# Patient Record
Sex: Female | Born: 1980 | Race: White | Hispanic: No | Marital: Single | Smoking: Former smoker
Health system: Southern US, Community
[De-identification: ages and names within clinical notes are randomized; demographics above are authoritative.]

## PROBLEM LIST (undated history)

## (undated) DIAGNOSIS — D649 Anemia, unspecified: Secondary | ICD-10-CM

## (undated) HISTORY — PX: TUBAL LIGATION: SHX77

---

## 2013-08-30 ENCOUNTER — Emergency Department (HOSPITAL_BASED_OUTPATIENT_CLINIC_OR_DEPARTMENT_OTHER)
Admission: EM | Admit: 2013-08-30 | Discharge: 2013-08-30 | Disposition: A | Discharge status: Home / Self Care | Payer: Self-pay | Attending: Emergency Medicine | Admitting: Emergency Medicine

## 2013-08-30 ENCOUNTER — Encounter (HOSPITAL_BASED_OUTPATIENT_CLINIC_OR_DEPARTMENT_OTHER): Payer: Self-pay | Admitting: Emergency Medicine

## 2013-08-30 DIAGNOSIS — F17200 Nicotine dependence, unspecified, uncomplicated: Secondary | ICD-10-CM | POA: Insufficient documentation

## 2013-08-30 DIAGNOSIS — J329 Chronic sinusitis, unspecified: Secondary | ICD-10-CM | POA: Insufficient documentation

## 2013-08-30 MED ORDER — AMOXICILLIN 500 MG PO CAPS: 500.0000 mg | ORAL_CAPSULE | Freq: Three times a day (TID) | ORAL | Status: DC

## 2013-08-30 NOTE — ED Provider Notes (Signed)
CSN: [id number]     Arrival date & time 08/30/13  1646 History   First MD Initiated Contact with Patient 08/30/13 1711     Chief Complaint  Patient presents with  . Facial Pain     (Consider location/radiation/quality/duration/timing/severity/associated sxs/prior Treatment) HPI Comments: Pt states that she has had facial pain and headache on the left side for 1 week. Pt states that the symptoms started after having a cold. Denies fever or visual changes.  The history is provided by the patient. No language interpreter was used.    History reviewed. No pertinent past medical history. Past Surgical History  Procedure Laterality Date  . Tubal ligation     No family history on file. History  Substance Use Topics  . Smoking status: Current Every Day Smoker  . Smokeless tobacco: Not on file  . Alcohol Use: No   OB History   Grav Para Term Preterm Abortions TAB SAB Ect Mult Living                 Review of Systems  Constitutional: Negative.   Respiratory: Negative.   Cardiovascular: Negative.       Allergies  Review of patient's allergies indicates no known allergies.  Home Medications   Prior to Admission medications   Medication Sig Start Date End Date Taking? Authorizing Provider  amoxicillin (AMOXIL) 500 MG capsule Take 1 capsule (500 mg total) by mouth 3 (three) times daily. 08/30/13   Teressa LowerVrinda Tiannah Greenly, NP   BP 145/84  Pulse 77  Temp(Src) 97.9 F (36.6 C) (Oral)  Resp 18  Ht 5\' 5"  (1.651 m)  Wt 170 lb (77.111 kg)  BMI 28.29 kg/m2  SpO2 100%  LMP 08/23/2013 Physical Exam  Nursing note and vitals reviewed. Constitutional: She is oriented to person, place, and time. She appears well-developed and well-nourished.  HENT:  Right Ear: External ear normal.  Left Ear: External ear normal.  Nose: Mucosal edema present. Left sinus exhibits frontal sinus tenderness.  Cardiovascular: Normal rate and regular rhythm.   Pulmonary/Chest: Effort normal and breath sounds  normal.  Musculoskeletal: Normal range of motion.  Neurological: She is alert and oriented to person, place, and time.    ED Course  Procedures (including critical care time) Labs Review Labs Reviewed - No data to display  Imaging Review No results found.   EKG Interpretation None      MDM   Final diagnoses:  Sinusitis    Will treat for sinusitis     Teressa LowerVrinda Edna Rede, NP 08/30/13 1746

## 2013-08-30 NOTE — ED Notes (Addendum)
C/o sinus pressure x 1 week-no relief with OTC meds

## 2013-08-30 NOTE — Discharge Instructions (Signed)

## 2013-08-30 NOTE — ED Provider Notes (Signed)
Medical screening examination/treatment/procedure(s) were performed by non-physician practitioner and as supervising physician I was immediately available for consultation/collaboration.   EKG Interpretation None        William Lelania Bia, MD 08/30/13 1953 

## 2013-11-27 ENCOUNTER — Encounter (HOSPITAL_BASED_OUTPATIENT_CLINIC_OR_DEPARTMENT_OTHER): Payer: Self-pay | Admitting: Emergency Medicine

## 2013-11-27 ENCOUNTER — Emergency Department (HOSPITAL_BASED_OUTPATIENT_CLINIC_OR_DEPARTMENT_OTHER)
Admission: EM | Admit: 2013-11-27 | Discharge: 2013-11-27 | Disposition: A | Discharge status: Home / Self Care | Payer: BC Managed Care – PPO | Attending: Emergency Medicine | Admitting: Emergency Medicine

## 2013-11-27 DIAGNOSIS — J0101 Acute recurrent maxillary sinusitis: Secondary | ICD-10-CM

## 2013-11-27 DIAGNOSIS — J320 Chronic maxillary sinusitis: Secondary | ICD-10-CM | POA: Insufficient documentation

## 2013-11-27 DIAGNOSIS — F17200 Nicotine dependence, unspecified, uncomplicated: Secondary | ICD-10-CM | POA: Insufficient documentation

## 2013-11-27 MED ORDER — FLUTICASONE PROPIONATE 50 MCG/ACT NA SUSP: 2.0000 | Freq: Every day | NASAL | Status: DC

## 2013-11-27 MED ORDER — SALINE SPRAY 0.65 % NA SOLN: 1.0000 | NASAL | Status: DC | PRN

## 2013-11-27 MED ORDER — AMOXICILLIN-POT CLAVULANATE 875-125 MG PO TABS: 1.0000 | ORAL_TABLET | Freq: Two times a day (BID) | ORAL | Status: DC

## 2013-11-27 NOTE — ED Provider Notes (Signed)
CSN: [id number]     Arrival date & time 11/27/13  2039 History  This chart was scribed for Shon Baton, MD by Evon Slack, ED Scribe. This patient was seen in room MH09/MH09 and the patient's care was started at 8:57 PM.     Chief Complaint  Patient presents with  . URI   Patient is a 33 y.o. female presenting with URI. The history is provided by the patient. No language interpreter was used.  URI Presenting symptoms: congestion   Presenting symptoms: no cough, no ear pain and no fever   Associated symptoms: no headaches    HPI Comments: Natalie Vega is a 34 y.o. female who presents to the Emergency Department complaining of re occuring sinus infection onset 5 days. She states she has been having congestion, ear pain, rhinorrhea, and headache. She states she has been taking mucinex with no relief. She states that ibuprofen has provided some relief.  She denies fever, cough sore throat, nausea, or vomiting. Patient reports that she's had several episodes of sinusitis this year requiring antibiotics.   History reviewed. No pertinent past medical history. Past Surgical History  Procedure Laterality Date  . Tubal ligation     No family history on file. History  Substance Use Topics  . Smoking status: Current Every Day Smoker  . Smokeless tobacco: Not on file  . Alcohol Use: No   OB History   Grav Para Term Preterm Abortions TAB SAB Ect Mult Living                 Review of Systems  Constitutional: Negative for fever.  HENT: Positive for congestion and sinus pressure. Negative for ear pain.   Respiratory: Negative for cough, chest tightness and shortness of breath.   Cardiovascular: Negative for chest pain.  Gastrointestinal: Negative for nausea, vomiting and abdominal pain.  Genitourinary: Negative for dysuria.  Skin: Negative for rash.  Neurological: Negative for headaches.  All other systems reviewed and are negative.     Allergies  Review of patient's  allergies indicates no known allergies.  Home Medications   Prior to Admission medications   Medication Sig Start Date End Date Taking? Authorizing Provider  amoxicillin (AMOXIL) 500 MG capsule Take 1 capsule (500 mg total) by mouth 3 (three) times daily. 08/30/13   Teressa Lower, NP  amoxicillin-clavulanate (AUGMENTIN) 875-125 MG per tablet Take 1 tablet by mouth every 12 (twelve) hours. 11/27/13   Shon Baton, MD  fluticasone (FLONASE) 50 MCG/ACT nasal spray Place 2 sprays into both nostrils daily. 11/27/13   Shon Baton, MD  sodium chloride (OCEAN) 0.65 % SOLN nasal spray Place 1 spray into both nostrils as needed for congestion. 11/27/13   Shon Baton, MD   Triage Vitals: BP 147/83  Pulse 87  Temp(Src) 98 F (36.7 C) (Oral)  Resp 18  Ht 5\' 6"  (1.676 m)  Wt 170 lb (77.111 kg)  BMI 27.45 kg/m2  SpO2 100%  LMP 11/11/2013  Physical Exam  Nursing note and vitals reviewed. Constitutional: She is oriented to person, place, and time. She appears well-developed and well-nourished. No distress.  HENT:  Head: Normocephalic and atraumatic.  Right Ear: External ear normal.  Left Ear: External ear normal.  Mouth/Throat: Oropharynx is clear and moist.  Tenderness palpation of the bilateral maxillary sinuses  Eyes: Pupils are equal, round, and reactive to light.  Neck: Neck supple.  Cardiovascular: Normal rate, regular rhythm and normal heart sounds.   No murmur heard. Pulmonary/Chest:  Effort normal and breath sounds normal. No respiratory distress. She has no wheezes.  Musculoskeletal: She exhibits no edema.  Neurological: She is alert and oriented to person, place, and time.  Skin: Skin is warm and dry.  Psychiatric: She has a normal mood and affect.    ED Course  Procedures (including critical care time) DIAGNOSTIC STUDIES: Oxygen Saturation is 100% on RA, normal by my interpretation.    COORDINATION OF CARE:    Labs Review Labs Reviewed - No data to  display  Imaging Review No results found.   EKG Interpretation None      MDM   Final diagnoses:  Recurrent maxillary sinusitis, unspecified chronicity   Patient presents with recurrent sinusitis.  Nontoxic and afebrile on exam. Patient has not maximize supportive care including Flonase or nasal saline. She states that she is not usually tolerates these very well. Given that she is well-appearing, discussed with patient holding off on antibiotics for several days to see symptoms of resolve with better supportive care. Patient stated understanding. She will be given a prescription but will hold for the next 2-3 days. She will also be referred to ENT given recurrence of symptoms.  After history, exam, and medical workup I feel the patient has been appropriately medically screened and is safe for discharge home. Pertinent diagnoses were discussed with the patient. Patient was given return precautions.   I personally performed the services described in this documentation, which was scribed in my presence. The recorded information has been reviewed and is accurate.      Shon Batonourtney F Horton, MD 11/27/13 2121

## 2013-11-27 NOTE — Discharge Instructions (Signed)
Sinusitis Hold antibiotics for 2-3 days to see if symptoms self resolve with supportive treatment.  Sinusitis is redness, soreness, and swelling (inflammation) of the paranasal sinuses. Paranasal sinuses are air pockets within the bones of your face (beneath the eyes, the middle of the forehead, or above the eyes). In healthy paranasal sinuses, mucus is able to drain out, and air is able to circulate through them by way of your nose. However, when your paranasal sinuses are inflamed, mucus and air can become trapped. This can allow bacteria and other germs to grow and cause infection. Sinusitis can develop quickly and last only a short time (acute) or continue over a long period (chronic). Sinusitis that lasts for more than 12 weeks is considered chronic.  CAUSES  Causes of sinusitis include:  Allergies.  Structural abnormalities, such as displacement of the cartilage that separates your nostrils (deviated septum), which can decrease the air flow through your nose and sinuses and affect sinus drainage.  Functional abnormalities, such as when the small hairs (cilia) that line your sinuses and help remove mucus do not work properly or are not present. SYMPTOMS  Symptoms of acute and chronic sinusitis are the same. The primary symptoms are pain and pressure around the affected sinuses. Other symptoms include:  Upper toothache.  Earache.  Headache.  Bad breath.  Decreased sense of smell and taste.  A cough, which worsens when you are lying flat.  Fatigue.  Fever.  Thick drainage from your nose, which often is green and may contain pus (purulent).  Swelling and warmth over the affected sinuses. DIAGNOSIS  Your caregiver will perform a physical exam. During the exam, your caregiver may:  Look in your nose for signs of abnormal growths in your nostrils (nasal polyps).  Tap over the affected sinus to check for signs of infection.  View the inside of your sinuses (endoscopy) with a  special imaging device with a light attached (endoscope), which is inserted into your sinuses. If your caregiver suspects that you have chronic sinusitis, one or more of the following tests may be recommended:  Allergy tests.  Nasal culture--A sample of mucus is taken from your nose and sent to a lab and screened for bacteria.  Nasal cytology--A sample of mucus is taken from your nose and examined by your caregiver to determine if your sinusitis is related to an allergy. TREATMENT  Most cases of acute sinusitis are related to a viral infection and will resolve on their own within 10 days. Sometimes medicines are prescribed to help relieve symptoms (pain medicine, decongestants, nasal steroid sprays, or saline sprays).  However, for sinusitis related to a bacterial infection, your caregiver will prescribe antibiotic medicines. These are medicines that will help kill the bacteria causing the infection.  Rarely, sinusitis is caused by a fungal infection. In theses cases, your caregiver will prescribe antifungal medicine. For some cases of chronic sinusitis, surgery is needed. Generally, these are cases in which sinusitis recurs more than 3 times per year, despite other treatments. HOME CARE INSTRUCTIONS   Drink plenty of water. Water helps thin the mucus so your sinuses can drain more easily.  Use a humidifier.  Inhale steam 3 to 4 times a day (for example, sit in the bathroom with the shower running).  Apply a warm, moist washcloth to your face 3 to 4 times a day, or as directed by your caregiver.  Use saline nasal sprays to help moisten and clean your sinuses.  Take over-the-counter or prescription medicines for  pain, discomfort, or fever only as directed by your caregiver. SEEK IMMEDIATE MEDICAL CARE IF:  You have increasing pain or severe headaches.  You have nausea, vomiting, or drowsiness.  You have swelling around your face.  You have vision problems.  You have a stiff  neck.  You have difficulty breathing. MAKE SURE YOU:   Understand these instructions.  Will watch your condition.  Will get help right away if you are not doing well or get worse. Document Released: 05/03/2005 Document Revised: 07/26/2011 Document Reviewed: 05/18/2011 [facility] Patient Information 2015 [place], [place]. This information is not intended to replace advice given to you by your health care provider. Make sure you discuss any questions you have with your health care provider.

## 2013-11-27 NOTE — ED Notes (Signed)
Head congestion and HA x 2-3 days

## 2014-04-04 ENCOUNTER — Encounter (HOSPITAL_BASED_OUTPATIENT_CLINIC_OR_DEPARTMENT_OTHER): Payer: Self-pay

## 2014-04-04 ENCOUNTER — Emergency Department (HOSPITAL_BASED_OUTPATIENT_CLINIC_OR_DEPARTMENT_OTHER)
Admission: EM | Admit: 2014-04-04 | Discharge: 2014-04-04 | Disposition: A | Discharge status: Home / Self Care | Payer: BC Managed Care – PPO | Attending: Emergency Medicine | Admitting: Emergency Medicine

## 2014-04-04 DIAGNOSIS — J069 Acute upper respiratory infection, unspecified: Secondary | ICD-10-CM | POA: Diagnosis not present

## 2014-04-04 DIAGNOSIS — Z720 Tobacco use: Secondary | ICD-10-CM | POA: Diagnosis not present

## 2014-04-04 DIAGNOSIS — J0100 Acute maxillary sinusitis, unspecified: Secondary | ICD-10-CM | POA: Diagnosis not present

## 2014-04-04 DIAGNOSIS — R05 Cough: Secondary | ICD-10-CM | POA: Diagnosis present

## 2014-04-04 MED ORDER — AMOXICILLIN 500 MG PO CAPS: 500.0000 mg | ORAL_CAPSULE | Freq: Three times a day (TID) | ORAL | Status: DC

## 2014-04-04 MED ORDER — FLUTICASONE PROPIONATE 50 MCG/ACT NA SUSP: 2.0000 | Freq: Every day | NASAL | Status: DC

## 2014-04-04 NOTE — Discharge Instructions (Signed)
Take antibiotic for the next week. It is important to complete the course of antibiotics.  Sinusitis Sinusitis is redness, soreness, and inflammation of the paranasal sinuses. Paranasal sinuses are air pockets within the bones of your face (beneath the eyes, the middle of the forehead, or above the eyes). In healthy paranasal sinuses, mucus is able to drain out, and air is able to circulate through them by way of your nose. However, when your paranasal sinuses are inflamed, mucus and air can become trapped. This can allow bacteria and other germs to grow and cause infection. Sinusitis can develop quickly and last only a short time (acute) or continue over a long period (chronic). Sinusitis that lasts for more than 12 weeks is considered chronic.  CAUSES  Causes of sinusitis include:  Allergies.  Structural abnormalities, such as displacement of the cartilage that separates your nostrils (deviated septum), which can decrease the air flow through your nose and sinuses and affect sinus drainage.  Functional abnormalities, such as when the small hairs (cilia) that line your sinuses and help remove mucus do not work properly or are not present. SIGNS AND SYMPTOMS  Symptoms of acute and chronic sinusitis are the same. The primary symptoms are pain and pressure around the affected sinuses. Other symptoms include:  Upper toothache.  Earache.  Headache.  Bad breath.  Decreased sense of smell and taste.  A cough, which worsens when you are lying flat.  Fatigue.  Fever.  Thick drainage from your nose, which often is green and may contain pus (purulent).  Swelling and warmth over the affected sinuses. DIAGNOSIS  Your health care provider will perform a physical exam. During the exam, your health care provider may:  Look in your nose for signs of abnormal growths in your nostrils (nasal polyps).  Tap over the affected sinus to check for signs of infection.  View the inside of your  sinuses (endoscopy) using an imaging device that has a light attached (endoscope). If your health care provider suspects that you have chronic sinusitis, one or more of the following tests may be recommended:  Allergy tests.  Nasal culture. A sample of mucus is taken from your nose, sent to a lab, and screened for bacteria.  Nasal cytology. A sample of mucus is taken from your nose and examined by your health care provider to determine if your sinusitis is related to an allergy. TREATMENT  Most cases of acute sinusitis are related to a viral infection and will resolve on their own within 10 days. Sometimes medicines are prescribed to help relieve symptoms (pain medicine, decongestants, nasal steroid sprays, or saline sprays).  However, for sinusitis related to a bacterial infection, your health care provider will prescribe antibiotic medicines. These are medicines that will help kill the bacteria causing the infection.  Rarely, sinusitis is caused by a fungal infection. In theses cases, your health care provider will prescribe antifungal medicine. For some cases of chronic sinusitis, surgery is needed. Generally, these are cases in which sinusitis recurs more than 3 times per year, despite other treatments. HOME CARE INSTRUCTIONS   Drink plenty of water. Water helps thin the mucus so your sinuses can drain more easily.  Use a humidifier.  Inhale steam 3 to 4 times a day (for example, sit in the bathroom with the shower running).  Apply a warm, moist washcloth to your face 3 to 4 times a day, or as directed by your health care provider.  Use saline nasal sprays to help moisten  and clean your sinuses.  Take medicines only as directed by your health care provider.  If you were prescribed either an antibiotic or antifungal medicine, finish it all even if you start to feel better. SEEK IMMEDIATE MEDICAL CARE IF:  You have increasing pain or severe headaches.  You have nausea, vomiting, or  drowsiness.  You have swelling around your face.  You have vision problems.  You have a stiff neck.  You have difficulty breathing. MAKE SURE YOU:   Understand these instructions.  Will watch your condition.  Will get help right away if you are not doing well or get worse. Document Released: 05/03/2005 Document Revised: 09/17/2013 Document Reviewed: 05/18/2011 [facility] Patient Information 2015 [place], [place]. This information is not intended to replace advice given to you by your health care provider. Make sure you discuss any questions you have with your health care provider.  Upper Respiratory Infection, Adult An upper respiratory infection (URI) is also sometimes known as the common cold. The upper respiratory tract includes the nose, sinuses, throat, trachea, and bronchi. Bronchi are the airways leading to the lungs. Most people improve within 1 week, but symptoms can last up to 2 weeks. A residual cough may last even longer.  CAUSES Many different viruses can infect the tissues lining the upper respiratory tract. The tissues become irritated and inflamed and often become very moist. Mucus production is also common. A cold is contagious. You can easily spread the virus to others by oral contact. This includes kissing, sharing a glass, coughing, or sneezing. Touching your mouth or nose and then touching a surface, which is then touched by another person, can also spread the virus. SYMPTOMS  Symptoms typically develop 1 to 3 days after you come in contact with a cold virus. Symptoms vary from person to person. They may include:  Runny nose.  Sneezing.  Nasal congestion.  Sinus irritation.  Sore throat.  Loss of voice (laryngitis).  Cough.  Fatigue.  Muscle aches.  Loss of appetite.  Headache.  Low-grade fever. DIAGNOSIS  You might diagnose your own cold based on familiar symptoms, since most people get a cold 2 to 3 times a year. Your caregiver can confirm this  based on your exam. Most importantly, your caregiver can check that your symptoms are not due to another disease such as strep throat, sinusitis, pneumonia, asthma, or epiglottitis. Blood tests, throat tests, and X-rays are not necessary to diagnose a common cold, but they may sometimes be helpful in excluding other more serious diseases. Your caregiver will decide if any further tests are required. RISKS AND COMPLICATIONS  You may be at risk for a more severe case of the common cold if you smoke cigarettes, have chronic heart disease (such as heart failure) or lung disease (such as asthma), or if you have a weakened immune system. The very young and very old are also at risk for more serious infections. Bacterial sinusitis, middle ear infections, and bacterial pneumonia can complicate the common cold. The common cold can worsen asthma and chronic obstructive pulmonary disease (COPD). Sometimes, these complications can require emergency medical care and may be life-threatening. PREVENTION  The best way to protect against getting a cold is to practice good hygiene. Avoid oral or hand contact with people with cold symptoms. Wash your hands often if contact occurs. There is no clear evidence that vitamin C, vitamin E, echinacea, or exercise reduces the chance of developing a cold. However, it is always recommended to get plenty of  rest and practice good nutrition. TREATMENT  Treatment is directed at relieving symptoms. There is no cure. Antibiotics are not effective, because the infection is caused by a virus, not by bacteria. Treatment may include:  Increased fluid intake. Sports drinks offer valuable electrolytes, sugars, and fluids.  Breathing heated mist or steam (vaporizer or shower).  Eating chicken soup or other clear broths, and maintaining good nutrition.  Getting plenty of rest.  Using gargles or lozenges for comfort.  Controlling fevers with ibuprofen or acetaminophen as directed by your  caregiver.  Increasing usage of your inhaler if you have asthma. Zinc gel and zinc lozenges, taken in the first 24 hours of the common cold, can shorten the duration and lessen the severity of symptoms. Pain medicines may help with fever, muscle aches, and throat pain. A variety of non-prescription medicines are available to treat congestion and runny nose. Your caregiver can make recommendations and may suggest nasal or lung inhalers for other symptoms.  HOME CARE INSTRUCTIONS   Only take over-the-counter or prescription medicines for pain, discomfort, or fever as directed by your caregiver.  Use a warm mist humidifier or inhale steam from a shower to increase air moisture. This may keep secretions moist and make it easier to breathe.  Drink enough water and fluids to keep your urine clear or pale yellow.  Rest as needed.  Return to work when your temperature has returned to normal or as your caregiver advises. You may need to stay home longer to avoid infecting others. You can also use a face mask and careful hand washing to prevent spread of the virus. SEEK MEDICAL CARE IF:   After the first few days, you feel you are getting worse rather than better.  You need your caregiver's advice about medicines to control symptoms.  You develop chills, worsening shortness of breath, or brown or red sputum. These may be signs of pneumonia.  You develop yellow or brown nasal discharge or pain in the face, especially when you bend forward. These may be signs of sinusitis.  You develop a fever, swollen neck glands, pain with swallowing, or white areas in the back of your throat. These may be signs of strep throat. SEEK IMMEDIATE MEDICAL CARE IF:   You have a fever.  You develop severe or persistent headache, ear pain, sinus pain, or chest pain.  You develop wheezing, a prolonged cough, cough up blood, or have a change in your usual mucus (if you have chronic lung disease).  You develop sore  muscles or a stiff neck. Document Released: 10/27/2000 Document Revised: 07/26/2011 Document Reviewed: 08/08/2013 [facility] Patient Information 2015 [place], [place]. This information is not intended to replace advice given to you by your health care provider. Make sure you discuss any questions you have with your health care provider.

## 2014-04-04 NOTE — ED Notes (Signed)
Pt states sinus pressure with congestion and cough for 2 weeks. Has tried mucinex without relief. Denies fever/n/v/d

## 2014-04-04 NOTE — ED Provider Notes (Signed)
CSN: [id number]     Arrival date & time 04/04/14  1011 History   First MD Initiated Contact with Patient 04/04/14 1208     Chief Complaint  Patient presents with  . URI     (Consider location/radiation/quality/duration/timing/severity/associated sxs/prior Treatment) HPI This is a 33 year old female who presents to the emergency department complaining of sinus pressure, cough and congestion 2 weeks. Cough is nonproductive. States she feels drip down the back of her throat. Denies fevers, nausea, vomiting or diarrhea. She has tried taking multiple over-the-counter medications including Mucinex, Sudafed and Nasonex with no relief. She reports she frequently gets sinus infections, especially around weather changes.  History reviewed. No pertinent past medical history. Past Surgical History  Procedure Laterality Date  . Tubal ligation     History reviewed. No pertinent family history. History  Substance Use Topics  . Smoking status: Current Every Day Smoker  . Smokeless tobacco: Not on file  . Alcohol Use: No   OB History    No data available     Review of Systems  10 Systems reviewed and are negative for acute change except as noted in the HPI.  Allergies  Review of patient's allergies indicates no known allergies.  Home Medications   Prior to Admission medications   Medication Sig Start Date End Date Taking? Authorizing Provider  amoxicillin (AMOXIL) 500 MG capsule Take 1 capsule (500 mg total) by mouth 3 (three) times daily. 04/04/14   Jayce Kainz M Aprel Egelhoff, PA-C  amoxicillin-clavulanate (AUGMENTIN) 875-125 MG per tablet Take 1 tablet by mouth every 12 (twelve) hours. 11/27/13   Shon Batonourtney F Horton, MD  fluticasone (FLONASE) 50 MCG/ACT nasal spray Place 2 sprays into both nostrils daily. 04/04/14   Kathrynn Speedobyn M Jetaun Colbath, PA-C  sodium chloride (OCEAN) 0.65 % SOLN nasal spray Place 1 spray into both nostrils as needed for congestion. 11/27/13   Shon Batonourtney F Horton, MD   BP 132/68 mmHg  Pulse 78   Temp(Src) 98.8 F (37.1 C) (Oral)  Resp 16  Wt 170 lb (77.111 kg)  SpO2 100%  LMP 03/17/2014 Physical Exam  Constitutional: She is oriented to person, place, and time. She appears well-developed and well-nourished. No distress.  HENT:  Head: Normocephalic and atraumatic.  Right Ear: Tympanic membrane and ear canal normal.  Left Ear: Tympanic membrane and ear canal normal.  Nasal congestion, mucosal edema, post nasal drip. Bilateral maxillary sinus tenderness.  Eyes: Conjunctivae and EOM are normal.  Neck: Normal range of motion. Neck supple.  Cardiovascular: Normal rate, regular rhythm and normal heart sounds.   Pulmonary/Chest: Effort normal and breath sounds normal. No respiratory distress.  Musculoskeletal: Normal range of motion. She exhibits no edema.  Neurological: She is alert and oriented to person, place, and time. No sensory deficit.  Skin: Skin is warm and dry.  Psychiatric: She has a normal mood and affect. Her behavior is normal.  Nursing note and vitals reviewed.   ED Course  Procedures (including critical care time) Labs Review Labs Reviewed - No data to display  Imaging Review No results found.   EKG Interpretation None      MDM   Final diagnoses:  Acute maxillary sinusitis, recurrence not specified  URI (upper respiratory infection)  Patient in no apparent distress. Afebrile, vital signs stable. Given symptoms have been present for 2 weeks and no improvement with over-the-counter medications, will treat with Amoxil. Advised her to continue symptomatic treatment. Stable for d/c. Return precautions given. Patient states understanding of treatment care plan and is  agreeable.  Kathrynn SpeedRobyn M Shyne Resch, PA-C 04/04/14 1255  Rolan BuccoMelanie Belfi, MD 04/04/14 [phone]

## 2014-09-01 ENCOUNTER — Emergency Department (HOSPITAL_BASED_OUTPATIENT_CLINIC_OR_DEPARTMENT_OTHER)
Admission: EM | Admit: 2014-09-01 | Discharge: 2014-09-01 | Disposition: A | Discharge status: Home / Self Care | Payer: BLUE CROSS/BLUE SHIELD | Attending: Emergency Medicine | Admitting: Emergency Medicine

## 2014-09-01 ENCOUNTER — Emergency Department (HOSPITAL_BASED_OUTPATIENT_CLINIC_OR_DEPARTMENT_OTHER): Payer: BLUE CROSS/BLUE SHIELD

## 2014-09-01 ENCOUNTER — Encounter (HOSPITAL_BASED_OUTPATIENT_CLINIC_OR_DEPARTMENT_OTHER): Payer: Self-pay

## 2014-09-01 DIAGNOSIS — Z7951 Long term (current) use of inhaled steroids: Secondary | ICD-10-CM | POA: Diagnosis not present

## 2014-09-01 DIAGNOSIS — Z792 Long term (current) use of antibiotics: Secondary | ICD-10-CM | POA: Diagnosis not present

## 2014-09-01 DIAGNOSIS — Z720 Tobacco use: Secondary | ICD-10-CM | POA: Diagnosis not present

## 2014-09-01 DIAGNOSIS — J0100 Acute maxillary sinusitis, unspecified: Secondary | ICD-10-CM | POA: Diagnosis not present

## 2014-09-01 DIAGNOSIS — R05 Cough: Secondary | ICD-10-CM | POA: Diagnosis present

## 2014-09-01 DIAGNOSIS — J029 Acute pharyngitis, unspecified: Secondary | ICD-10-CM | POA: Diagnosis not present

## 2014-09-01 DIAGNOSIS — Z79899 Other long term (current) drug therapy: Secondary | ICD-10-CM | POA: Diagnosis not present

## 2014-09-01 DIAGNOSIS — R059 Cough, unspecified: Secondary | ICD-10-CM

## 2014-09-01 MED ORDER — AMOXICILLIN-POT CLAVULANATE 875-125 MG PO TABS: 1.0000 | ORAL_TABLET | Freq: Two times a day (BID) | ORAL | Status: DC

## 2014-09-01 MED ORDER — AMOXICILLIN-POT CLAVULANATE 875-125 MG PO TABS
1.0000 | ORAL_TABLET | Freq: Once | ORAL | Status: AC
  Administered 2014-09-01: 1 via ORAL
  Filled 2014-09-01: qty 1

## 2014-09-01 NOTE — ED Notes (Signed)
Pt received at shift change. Care assumed at time of d/c. Pt seen by EDPA prior to RN assessment, see PA notes, orders received to medicate and d/c. VSS.  Pt alert, NAD, calm, interactive, resps e/u, speaking in clear complete sentences, no dyspnea noted, pt texting on cell phone, "ready to go". Denies needs or questions, given Rx x1, steady gait to d/c desk.

## 2014-09-01 NOTE — ED Provider Notes (Signed)
CSN: [id number]     Arrival date & time 09/01/14  1815 History   First MD Initiated Contact with Patient 09/01/14 1828     Chief Complaint  Patient presents with  . Cough     (Consider location/radiation/quality/duration/timing/severity/associated sxs/prior Treatment) Patient is a 34 y.o. female presenting with cough. The history is provided by the patient. No language interpreter was used.  Cough Cough characteristics:  Productive Sputum characteristics:  Yellow Severity:  Moderate Onset quality:  Gradual Duration:  2 weeks Timing:  Constant Progression:  Unchanged Chronicity:  New Smoker: yes   Context: not animal exposure, not exposure to allergens, not fumes, not occupational exposure, not sick contacts, not smoke exposure, not upper respiratory infection and not with activity   Relieved by:  Nothing Worsened by:  Nothing tried Ineffective treatments:  Rest and fluids Associated symptoms: sinus congestion and sore throat   Risk factors: no chemical exposure, no recent infection and no recent travel     History reviewed. No pertinent past medical history. Past Surgical History  Procedure Laterality Date  . Tubal ligation     No family history on file. History  Substance Use Topics  . Smoking status: Current Every Day Smoker -- 0.50 packs/day    Types: Cigarettes  . Smokeless tobacco: Not on file  . Alcohol Use: No   OB History    No data available     Review of Systems  HENT: Positive for congestion, sinus pressure and sore throat.   Respiratory: Positive for cough.   All other systems reviewed and are negative.     Allergies  Review of patient's allergies indicates no known allergies.  Home Medications   Prior to Admission medications   Medication Sig Start Date End Date Taking? Authorizing Provider  dextromethorphan-guaiFENesin (MUCINEX DM) 30-600 MG per 12 hr tablet Take 1 tablet by mouth 2 (two) times daily.   Yes Historical Provider, MD   amoxicillin (AMOXIL) 500 MG capsule Take 1 capsule (500 mg total) by mouth 3 (three) times daily. 04/04/14   Robyn M Hess, PA-C  amoxicillin-clavulanate (AUGMENTIN) 875-125 MG per tablet Take 1 tablet by mouth every 12 (twelve) hours. 11/27/13   Shon Baton, MD  fluticasone (FLONASE) 50 MCG/ACT nasal spray Place 2 sprays into both nostrils daily. 04/04/14   Kathrynn Speed, PA-C  sodium chloride (OCEAN) 0.65 % SOLN nasal spray Place 1 spray into both nostrils as needed for congestion. 11/27/13   Shon Baton, MD   BP 148/82 mmHg  Pulse 95  Temp(Src) 98.9 F (37.2 C) (Oral)  Resp 21  Ht  (1.651 m)  Wt 161 lb (73.029 kg)  BMI 26.79 kg/m2  SpO2 100%  LMP 09/01/2014 Physical Exam  Constitutional: She is oriented to person, place, and time. She appears well-developed and well-nourished. No distress.  HENT:  Head: Normocephalic and atraumatic.  Mouth/Throat: Oropharynx is clear and moist. No oropharyngeal exudate.  Maxillary sinus tenderness to palpation.   Eyes: Conjunctivae are normal.  Neck: Normal range of motion.  Cardiovascular: Normal rate and regular rhythm.  Exam reveals no gallop and no friction rub.   No murmur heard. Pulmonary/Chest: Effort normal and breath sounds normal. She has no wheezes. She has no rales. She exhibits no tenderness.  Abdominal: Soft. There is no tenderness.  Musculoskeletal: Normal range of motion.  Neurological: She is alert and oriented to person, place, and time. Coordination normal.  Speech is goal-oriented. Moves limbs without ataxia.   Skin: Skin is  warm and dry.  Psychiatric: She has a normal mood and affect. Her behavior is normal.  Nursing note and vitals reviewed.   ED Course  Procedures (including critical care time) Labs Review Labs Reviewed - No data to display  Imaging Review Dg Chest 2 View  09/01/2014   CLINICAL DATA:  Cough, congestion, sinus pressure and sore throat for 2 weeks.  EXAM: CHEST  2 VIEW  COMPARISON:   None.  FINDINGS: The lungs are clear. Heart size is normal. There is no pneumothorax or pleural effusion. No focal bony abnormality is identified.  IMPRESSION: Negative chest.   Electronically Signed   By: Drusilla Kannerhomas  Dalessio M.D.   On: 09/01/2014 18:55     EKG Interpretation None      MDM   Final diagnoses:  Cough  Acute maxillary sinusitis, recurrence not specified    7:17 PM Chest xray unremarkable for acute changes. Patient will have Augmentin for sinusitis. Vitals stable and patient afebrile.     Emilia BeckKaitlyn Shamaria Kavan, PA-C 09/01/14 1921  Rolan BuccoMelanie Belfi, MD 09/01/14 2045

## 2014-09-01 NOTE — ED Notes (Signed)
Pt reports cough, congestion x 2 weeks, productive cough noted.  Also reports "i know i have a sinus infection", sinus congestion, postnasal drip, sinus pressure and sore throat.

## 2014-09-01 NOTE — Discharge Instructions (Signed)
Take Augmentin as directed until gone. Refer to attached documents for more information.  °

## 2015-12-21 ENCOUNTER — Emergency Department (HOSPITAL_BASED_OUTPATIENT_CLINIC_OR_DEPARTMENT_OTHER)
Admission: EM | Admit: 2015-12-21 | Discharge: 2015-12-21 | Disposition: A | Discharge status: Home / Self Care | Payer: BLUE CROSS/BLUE SHIELD | Attending: Emergency Medicine | Admitting: Emergency Medicine

## 2015-12-21 ENCOUNTER — Encounter (HOSPITAL_BASED_OUTPATIENT_CLINIC_OR_DEPARTMENT_OTHER): Payer: Self-pay | Admitting: *Deleted

## 2015-12-21 DIAGNOSIS — F17210 Nicotine dependence, cigarettes, uncomplicated: Secondary | ICD-10-CM | POA: Diagnosis not present

## 2015-12-21 DIAGNOSIS — Z79899 Other long term (current) drug therapy: Secondary | ICD-10-CM | POA: Insufficient documentation

## 2015-12-21 DIAGNOSIS — R05 Cough: Secondary | ICD-10-CM | POA: Diagnosis present

## 2015-12-21 DIAGNOSIS — J0101 Acute recurrent maxillary sinusitis: Secondary | ICD-10-CM | POA: Diagnosis not present

## 2015-12-21 MED ORDER — PENICILLIN V POTASSIUM 500 MG PO TABS: 500.0000 mg | ORAL_TABLET | Freq: Four times a day (QID) | ORAL | 0 refills | Status: AC

## 2015-12-21 MED ORDER — FLUCONAZOLE 150 MG PO TABS: 150.0000 mg | ORAL_TABLET | Freq: Every day | ORAL | 1 refills | Status: DC

## 2015-12-21 NOTE — ED Notes (Signed)
PA at bedside.

## 2015-12-21 NOTE — ED Triage Notes (Signed)
Patient states she has a one week history of sinus congestion, sinus pressure, headache and productive cough with greenish yellow secretions.

## 2015-12-21 NOTE — ED Provider Notes (Signed)
[facility] Provider Note   CSN: [id number] Arrival date & time: 12/21/15  1541  First Provider Contact:  First MD Initiated Contact with Patient 12/21/15 1716        By signing my name below, I, Natalie Vega, attest that this documentation has been prepared under the direction and in the presence of  Elliemae Braman, PA-C. Electronically Signed: Doreatha Vega, ED Scribe. 12/21/15. 5:22 PM.   History   Chief Complaint Chief Complaint  Patient presents with  . URI    HPI Natalie Vega is a 35 y.o. female who presents to the Emergency Department complaining of moderate, gradually worsening sinus pressure onset a week ago with associated frontal HA, chest congestion. She also complains of cough onset today. Pt states she has tried ibuprofen, Sudafed and Mucinex with little to no relief. She reports that her symptoms are worsened when leaning her head forward. She states her current symptoms feel similar to prior recurrent sinus infections. Pt states she previously obtained a referral to ENT, but was unable to see the specialist as her insurance lapsed. She denies fever, chills, nausea, emesis, additional complaints.   The history is provided by the patient. No language interpreter was used.    History reviewed. No pertinent past medical history.  There are no active problems to display for this patient.   Past Surgical History:  Procedure Laterality Date  . TUBAL LIGATION      OB History    No data available       Home Medications    Prior to Admission medications   Medication Sig Start Date End Date Taking? Authorizing Provider  dextromethorphan-guaiFENesin (MUCINEX DM) 30-600 MG per 12 hr tablet Take 1 tablet by mouth 2 (two) times daily.   Yes Historical Provider, MD  fluticasone (FLONASE) 50 MCG/ACT nasal spray Place 2 sprays into both nostrils daily. 04/04/14  Yes Robyn M Hess, PA-C  amoxicillin (AMOXIL) 500 MG capsule Take 1 capsule (500 mg total) by  mouth 3 (three) times daily. 04/04/14   Robyn M Hess, PA-C  amoxicillin-clavulanate (AUGMENTIN) 875-125 MG per tablet Take 1 tablet by mouth every 12 (twelve) hours. 09/01/14   Kaitlyn Szekalski, PA-C  sodium chloride (OCEAN) 0.65 % SOLN nasal spray Place 1 spray into both nostrils as needed for congestion. 11/27/13   Shon Baton, MD    Family History No family history on file.  Social History Social History  Substance Use Topics  . Smoking status: Current Every Day Smoker    Packs/day: 0.50    Types: Cigarettes  . Smokeless tobacco: Not on file  . Alcohol use No     Allergies   Review of patient's allergies indicates no known allergies.   Review of Systems Review of Systems  Constitutional: Negative for chills and fever.  HENT: Positive for congestion and sinus pressure.   Respiratory: Positive for cough.   Gastrointestinal: Negative for nausea and vomiting.  Neurological: Positive for headaches.     Physical Exam Updated Vital Signs BP 137/89 (BP Location: Left Arm)   Pulse 71   Temp 98.3 F (36.8 C) (Oral)   Resp 20   Ht  (1.651 m)   Wt 160 lb (72.6 kg)   LMP 12/14/2015   SpO2 100%   BMI 26.63 kg/m   Physical Exam  Constitutional: She appears well-developed and well-nourished.  HENT:  Head: Normocephalic.  Right Ear: Tympanic membrane normal.  Left Ear: Tympanic membrane normal.  Nose: Nose normal.  Mouth/Throat:  Oropharynx is clear and moist.  TTP of the frontal and maxillary sinuses.   Eyes: Conjunctivae are normal.  Cardiovascular: Normal rate, regular rhythm and normal heart sounds.   No murmur heard. Pulmonary/Chest: Effort normal and breath sounds normal. No respiratory distress. She has no wheezes. She has no rales.  Lungs CTA bilaterally.   Musculoskeletal: Normal range of motion.  Neurological: She is alert.  Skin: Skin is warm and dry.  Psychiatric: She has a normal mood and affect. Her behavior is normal.  Nursing note and vitals  reviewed.    ED Treatments / Results  Labs (all labs ordered are listed, but only abnormal results are displayed) Labs Reviewed - No data to display  EKG  EKG Interpretation None       Radiology No results found.  Procedures Procedures (including critical care time)  DIAGNOSTIC STUDIES: Oxygen Saturation is 100% on RA, normal by my interpretation.    COORDINATION OF CARE: 5:21 PM Discussed treatment plan with pt at bedside which includes penicillin, Diflucan and pt agreed to plan.    Medications Ordered in ED Medications - No data to display   Initial Impression / Assessment and Plan / ED Course  I have reviewed the triage vital signs and the nursing notes.  Pertinent labs & imaging results that were available during my care of the patient were reviewed by me and considered in my medical decision making (see chart for details).  Clinical Course    Pt symptoms consistent with Acute Sinusitis. Pt will be discharged with antibiotic and symptomatic treatment.  She reports that PCN has worked better for her than Augmentin and Amoxicillin in the past.  Discussed return precautions.  Pt is hemodynamically stable & in NAD prior to discharge.   Final Clinical Impressions(s) / ED Diagnoses   Final diagnoses:  None    New Prescriptions New Prescriptions   No medications on file   I personally performed the services described in this documentation, which was scribed in my presence. The recorded information has been reviewed and is accurate.    Jeri Rawlins, PA-C 0Santiago Glad8/07/17 2159    Lavera Guiseana Duo Liu, MD 12/24/15 [phone]

## 2016-03-22 ENCOUNTER — Encounter (HOSPITAL_BASED_OUTPATIENT_CLINIC_OR_DEPARTMENT_OTHER): Payer: Self-pay | Admitting: *Deleted

## 2016-03-22 ENCOUNTER — Emergency Department (HOSPITAL_BASED_OUTPATIENT_CLINIC_OR_DEPARTMENT_OTHER)
Admission: EM | Admit: 2016-03-22 | Discharge: 2016-03-22 | Disposition: A | Discharge status: Home / Self Care | Payer: BLUE CROSS/BLUE SHIELD | Attending: Emergency Medicine | Admitting: Emergency Medicine

## 2016-03-22 DIAGNOSIS — F17210 Nicotine dependence, cigarettes, uncomplicated: Secondary | ICD-10-CM | POA: Insufficient documentation

## 2016-03-22 DIAGNOSIS — L02211 Cutaneous abscess of abdominal wall: Secondary | ICD-10-CM | POA: Diagnosis present

## 2016-03-22 DIAGNOSIS — L0291 Cutaneous abscess, unspecified: Secondary | ICD-10-CM

## 2016-03-22 MED ORDER — SULFAMETHOXAZOLE-TRIMETHOPRIM 800-160 MG PO TABS: 1.0000 | ORAL_TABLET | Freq: Two times a day (BID) | ORAL | 0 refills | Status: AC

## 2016-03-22 MED FILL — SULFAMETHOXAZOLE/TMP DS TAB: 800-160 | 7 days supply | Qty: 14 | Fill #0

## 2016-03-22 NOTE — ED Notes (Signed)
Patient denies pain and is resting comfortably.  

## 2016-03-22 NOTE — Discharge Instructions (Signed)
You have been seen in the Emergency Department (ED) today for an abscess.  This was drained in the ED. ° °Please follow up with your doctor or in the ED in 24-48 hours for recheck of your wound.  Read through the additional discharge instructions included below regarding wound care recommendations.  Keep the wound clean and dry, though you may wash as you would normally.  Change the dressing twice daily. ° °Call your doctor sooner or return to the ED if you develop worsening signs of infection such as: increased redness, increased pain, pus, or fever. ° ° °Abscess °An abscess is an infected area that contains a collection of pus and debris. It can occur in almost any part of the body. An abscess is also known as a furuncle or boil. °CAUSES  °An abscess occurs when tissue gets infected. This can occur from blockage of oil or sweat glands, infection of hair follicles, or a minor injury to the skin. As the body tries to fight the infection, pus collects in the area and creates pressure under the skin. This pressure causes pain. People with weakened immune systems have difficulty fighting infections and get certain abscesses more often.  °SYMPTOMS °Usually an abscess develops on the skin and becomes a painful mass that is red, warm, and tender. If the abscess forms under the skin, you may feel a moveable soft area under the skin. Some abscesses break open (rupture) on their own, but most will continue to get worse without care. The infection can spread deeper into the body and eventually into the bloodstream, causing you to feel ill.  °DIAGNOSIS  °Your caregiver will take your medical history and perform a physical exam. A sample of fluid may also be taken from the abscess to determine what is causing your infection. °TREATMENT  °Your caregiver may prescribe antibiotic medicines to fight the infection. However, taking antibiotics alone usually does not cure an abscess. Your caregiver may need to make a small cut  (incision) in the abscess to drain the pus. In some cases, gauze is packed into the abscess to reduce pain and to continue draining the area. °HOME CARE INSTRUCTIONS  °Only take over-the-counter or prescription medicines for pain, discomfort, or fever as directed by your caregiver. °If you were prescribed antibiotics, take them as directed. Finish them even if you start to feel better. °If gauze is used, follow your caregiver's directions for changing the gauze. °To avoid spreading the infection: °Keep your draining abscess covered with a bandage. °Wash your hands well. °Do not share personal care items, towels, or whirlpools with others. °Avoid skin contact with others. °Keep your skin and clothes clean around the abscess. °Keep all follow-up appointments as directed by your caregiver. °SEEK MEDICAL CARE IF:  °You have increased pain, swelling, redness, fluid drainage, or bleeding. °You have muscle aches, chills, or a general ill feeling. °You have a fever. °MAKE SURE YOU:  °Understand these instructions. °Will watch your condition. °Will get help right away if you are not doing well or get worse. °Document Released: 02/10/2005 Document Revised: 11/02/2011 Document Reviewed: 07/16/2011 °[employer]® Patient Information ©2015 [employer]. This information is not intended to replace advice given to you by your health care provider. Make sure you discuss any questions you have with your health care provider. ° °Abscess °Care After °An abscess (also called a boil or furuncle) is an infected area that contains a collection of pus. Signs and symptoms of an abscess include pain, tenderness, redness, or hardness,   or you may feel a moveable soft area under your skin. An abscess can occur anywhere in the body. The infection may spread to surrounding tissues causing cellulitis. A cut (incision) by the surgeon was made over your abscess and the pus was drained out. Gauze may have been packed into the space to provide a drain  that will allow the cavity to heal from the inside outwards. The boil may be painful for 5 to 7 days. Most people with a boil do not have high fevers. Your abscess, if seen early, may not have localized, and may not have been lanced. If not, another appointment may be required for this if it does not get better on its own or with medications. °HOME CARE INSTRUCTIONS  °Only take over-the-counter or prescription medicines for pain, discomfort, or fever as directed by your caregiver. °When you bathe, soak and then remove gauze or iodoform packs at least daily or as directed by your caregiver. You may then wash the wound gently with mild soapy water. Repack with gauze or do as your caregiver directs. °SEEK IMMEDIATE MEDICAL CARE IF:  °You develop increased pain, swelling, redness, drainage, or bleeding in the wound site. °You develop signs of generalized infection including muscle aches, chills, fever, or a general ill feeling. °An oral temperature above 102° F (38.9° C) develops, not controlled by medication. °See your caregiver for a recheck if you develop any of the symptoms described above. If medications (antibiotics) were prescribed, take them as directed. °Document Released: 11/19/2004 Document Revised: 07/26/2011 Document Reviewed: 07/17/2007 °[employer]® Patient Information ©2015 [employer]. This information is not intended to replace advice given to you by your health care provider. Make sure you discuss any questions you have with your health care provider. ° °Cellulitis °Cellulitis is an infection of the skin and the tissue beneath it. The infected area is usually red and tender. Cellulitis occurs most often in the arms and lower legs.  °CAUSES  °Cellulitis is caused by bacteria that enter the skin through cracks or cuts in the skin. The most common types of bacteria that cause cellulitis are staphylococci and streptococci. °SIGNS AND SYMPTOMS  °Redness and warmth. °Swelling. °Tenderness or  pain. °Fever. °DIAGNOSIS  °Your health care provider can usually determine what is wrong based on a physical exam. Blood tests may also be done. °TREATMENT  °Treatment usually involves taking an antibiotic medicine. °HOME CARE INSTRUCTIONS  °Take your antibiotic medicine as directed by your health care provider. Finish the antibiotic even if you start to feel better. °Keep the infected arm or leg elevated to reduce swelling. °Apply a warm cloth to the affected area up to 4 times per day to relieve pain. °Take medicines only as directed by your health care provider. °Keep all follow-up visits as directed by your health care provider. °SEEK MEDICAL CARE IF:  °You notice red streaks coming from the infected area. °Your red area gets larger or turns dark in color. °Your bone or joint underneath the infected area becomes painful after the skin has healed. °Your infection returns in the same area or another area. °You notice a swollen bump in the infected area. °You develop new symptoms. °You have a fever. °SEEK IMMEDIATE MEDICAL CARE IF:  °You feel very sleepy. °You develop vomiting or diarrhea. °You have a general ill feeling (malaise) with muscle aches and pains. °MAKE SURE YOU:  °Understand these instructions. °Will watch your condition. °Will get help right away if you are not doing well or get   worse. °Document Released: 02/10/2005 Document Revised: 09/17/2013 Document Reviewed: 07/19/2011 °[employer]® Patient Information ©2015 [employer]. This information is not intended to replace advice given to you by your health care provider. Make sure you discuss any questions you have with your health care provider. ° ° ° °

## 2016-03-22 NOTE — ED Provider Notes (Signed)
Emergency Department Provider Note   I have reviewed the triage vital signs and the nursing notes.   HISTORY  Chief Complaint Abscess   HPI Natalie Vega is a 35 y.o. female with PMH of tubal ligation presents to the emergency department for evaluation of draining lesion on her abdomen. She noticed the area starting to accumulate 2 days ago. While at work today she took a Q-tip soaked in hydrogen peroxide and unroofed the area. Significant drainage started coming from that and she became concerned and presented to the emergency department. No fevers. No other areas of pain. No vaginal bleeding or discharge.She also notes areas of follicular irritation but is following with her PCP for this.    History reviewed. No pertinent past medical history.  There are no active problems to display for this patient.   Past Surgical History:  Procedure Laterality Date  . TUBAL LIGATION      Current Outpatient Rx  . Order #: [id number] Class: Print  . Order #: [id number] Class: Print  . Order #: [id number] Class: Historical Med  . Order #: [id number] Class: Print  . Order #: [id number] Class: Print  . Order #: [id number] Class: Print  . Order #: [id number] Class: Print    Allergies Patient has no known allergies.  No family history on file.  Social History Social History  Substance Use Topics  . Smoking status: Current Every Day Smoker    Packs/day: 0.50    Types: Cigarettes  . Smokeless tobacco: Never Used  . Alcohol use No    Review of Systems  Constitutional: No fever/chills Eyes: No visual changes. ENT: No sore throat. Cardiovascular: Denies chest pain. Respiratory: Denies shortness of breath. Gastrointestinal: No abdominal pain.  No nausea, no vomiting.  No diarrhea.  No constipation. Genitourinary: Negative for dysuria. Musculoskeletal: Negative for back pain. Skin: Negative for rash. Abscess on abdomen.  Neurological: Negative for headaches, focal weakness or  numbness.  10-point ROS otherwise negative.  ____________________________________________   PHYSICAL EXAM:  VITAL SIGNS: ED Triage Vitals  Enc Vitals Group     BP 03/22/16 1303 145/93     Pulse Rate 03/22/16 1303 88     Resp 03/22/16 1303 18     Temp 03/22/16 1303 98.4 F (36.9 C)     Temp Source 03/22/16 1303 Oral     SpO2 03/22/16 1303 100 %     Weight 03/22/16 1302 175 lb (79.4 kg)     Height 03/22/16 1302 5\' 5"  (1.651 m)     Pain Score 03/22/16 1301 3   Constitutional: Alert and oriented. Well appearing and in no acute distress. Eyes: Conjunctivae are normal. Head: Atraumatic. Nose: No congestion/rhinnorhea. Mouth/Throat: Mucous membranes are moist.  Oropharynx non-erythematous. Neck: No stridor.  Cardiovascular: Normal rate, regular rhythm. Good peripheral circulation. Grossly normal heart sounds.   Respiratory: Normal respiratory effort.  No retractions. Lungs CTAB. Gastrointestinal: Soft and nontender. No distention.  Musculoskeletal: No lower extremity tenderness nor edema. No gross deformities of extremities. Neurologic:  Normal speech and language. No gross focal neurologic deficits are appreciated.  Skin:  Skin is warm, dry and intact. Draining 3 cm abscess. Purulent material noted. Minimal surrounding erythema.    ____________________________________________   PROCEDURES  Procedure(s) performed:   Procedures  None ____________________________________________   INITIAL IMPRESSION / ASSESSMENT AND PLAN / ED COURSE  Pertinent labs & imaging results that were available during my care of the patient were reviewed by me and considered in my medical decision making (see chart for details).  Patient  resents emerge department for a small, simple abscess to the abdomen. The areas already unroofed and draining. Has minimal surrounding erythema. No induration. Patient is well-appearing with no signs of systemic infection. We will initiate Bactrim course and  primary care physician follow-up. Discussed return precautions in detail.  At this time, I do not feel there is any life-threatening condition present. I have reviewed and discussed all results (EKG, imaging, lab, urine as appropriate), exam findings with patient. I have reviewed nursing notes and appropriate previous records.  I feel the patient is safe to be discharged home without further emergent workup. Discussed usual and customary return precautions. Patient and family (if present) verbalize understanding and are comfortable with this plan.  Patient will follow-up with their primary care provider. If they do not have a primary care provider, information for follow-up has been provided to them. All questions have been answered.  ____________________________________________  FINAL CLINICAL IMPRESSION(S) / ED DIAGNOSES  Final diagnoses:  Abscess     MEDICATIONS GIVEN DURING THIS VISIT:  None  NEW OUTPATIENT MEDICATIONS STARTED DURING THIS VISIT:  Discharge Medication List as of 03/22/2016  3:19 PM    START taking these medications   Details  sulfamethoxazole-trimethoprim (BACTRIM DS,SEPTRA DS) 800-160 MG tablet Take 1 tablet by mouth 2 (two) times daily., Starting Mon 03/22/2016, Until Mon 03/29/2016, Print        Note:  This document was prepared using Dragon voice recognition software and may include unintentional dictation errors.  Alona BeneJoshua Jerre Diguglielmo, MD Emergency Medicine   Maia PlanJoshua G Aloni Chuang, MD 03/23/16 [phone]

## 2016-03-22 NOTE — ED Triage Notes (Addendum)
Small abscess to her lower abdomen. States it started after shaving the area. Area is open and draining greenish pus.

## 2016-04-04 ENCOUNTER — Emergency Department (HOSPITAL_BASED_OUTPATIENT_CLINIC_OR_DEPARTMENT_OTHER)
Admission: EM | Admit: 2016-04-04 | Discharge: 2016-04-04 | Disposition: A | Discharge status: Home / Self Care | Payer: BLUE CROSS/BLUE SHIELD | Attending: Emergency Medicine | Admitting: Emergency Medicine

## 2016-04-04 ENCOUNTER — Encounter (HOSPITAL_BASED_OUTPATIENT_CLINIC_OR_DEPARTMENT_OTHER): Payer: Self-pay | Admitting: *Deleted

## 2016-04-04 DIAGNOSIS — K0889 Other specified disorders of teeth and supporting structures: Secondary | ICD-10-CM | POA: Diagnosis present

## 2016-04-04 DIAGNOSIS — F17210 Nicotine dependence, cigarettes, uncomplicated: Secondary | ICD-10-CM | POA: Insufficient documentation

## 2016-04-04 DIAGNOSIS — K029 Dental caries, unspecified: Secondary | ICD-10-CM | POA: Diagnosis not present

## 2016-04-04 MED ORDER — PENICILLIN V POTASSIUM 500 MG PO TABS: 500.0000 mg | ORAL_TABLET | Freq: Three times a day (TID) | ORAL | 0 refills | Status: DC

## 2016-04-04 MED ORDER — IBUPROFEN 800 MG PO TABS: 800.0000 mg | ORAL_TABLET | Freq: Three times a day (TID) | ORAL | 0 refills | Status: DC

## 2016-04-04 NOTE — ED Notes (Signed)
Pt triaged by this RN 

## 2016-04-04 NOTE — ED Provider Notes (Signed)
[facility]   CSN: [id number] Arrival date & time: 04/04/16  1912  By signing my name below, I, Rosario AdieWilliam Andrew Vega, attest that this documentation has been prepared under the direction and in the presence of Fayrene HelperBowie Christinamarie Tall, PA-C.  Electronically Signed: Rosario AdieWilliam Andrew Vega, ED Scribe. 04/04/16. 7:33 PM.  History   Chief Complaint Chief Complaint  Patient presents with  . Dental Pain   The history is provided by the patient. No language interpreter was used.    HPI Comments: Natalie Vega is a 35 y.o. female who presents to the Emergency Department complaining of gradually worsening, constant right-sided, upper dental pain onset this morning. Pt woke up with her pain this morning, and she describes her pain as throbbing. Pt notes associated right ear pain secondary to her dental pain. She has been taking Ibuprofen, Tylenol, Goody Powder, and Mucinex with minimal relief fo her pain. Her pain is exacerbated with opening her mouth fully. Pt has a h/o same several months ago, but she is not currently followed by a dental specialist. Denies fever, facial swelling, or any other associated symptoms.   History reviewed. No pertinent past medical history.  There are no active problems to display for this patient.  Past Surgical History:  Procedure Laterality Date  . TUBAL LIGATION     OB History    No data available     Home Medications    Prior to Admission medications   Medication Sig Start Date End Date Taking? Authorizing Provider  amoxicillin (AMOXIL) 500 MG capsule Take 1 capsule (500 mg total) by mouth 3 (three) times daily. 04/04/14   Robyn M Hess, PA-C  amoxicillin-clavulanate (AUGMENTIN) 875-125 MG per tablet Take 1 tablet by mouth every 12 (twelve) hours. 09/01/14   Kaitlyn Szekalski, PA-C  dextromethorphan-guaiFENesin (MUCINEX DM) 30-600 MG per 12 hr tablet Take 1 tablet by mouth 2 (two) times daily.    Historical Provider, MD  fluconazole  (DIFLUCAN) 150 MG tablet Take 1 tablet (150 mg total) by mouth daily. 12/21/15   Heather Laisure, PA-C  fluticasone (FLONASE) 50 MCG/ACT nasal spray Place 2 sprays into both nostrils daily. 04/04/14   Kathrynn Speedobyn M Hess, PA-C  sodium chloride (OCEAN) 0.65 % SOLN nasal spray Place 1 spray into both nostrils as needed for congestion. 11/27/13   Shon Batonourtney F Horton, MD   Family History History reviewed. No pertinent family history.  Social History Social History  Substance Use Topics  . Smoking status: Current Every Day Smoker    Packs/day: 0.50    Types: Cigarettes  . Smokeless tobacco: Never Used  . Alcohol use No   Allergies   Patient has no known allergies.  Review of Systems Review of Systems  Constitutional: Negative for fever.  HENT: Positive for dental problem. Negative for facial swelling.    Physical Exam Updated Vital Signs BP 141/95 (BP Location: Left Arm)   Pulse 69   Temp 97.4 F (36.3 C) (Oral)   Resp 18   Ht 5\' 5"  (1.651 m)   Wt 170 lb (77.1 kg)   LMP 04/04/2016 (Exact Date)   SpO2 100%   BMI 28.29 kg/m   Physical Exam  Constitutional: She appears well-developed and well-nourished. No distress.  HENT:  Head: Normocephalic and atraumatic.  Right Ear: Tympanic membrane, external ear and ear canal normal.  Left Ear: Tympanic membrane, external ear and ear canal normal.  Nose: Nose normal.  Mouth/Throat: Uvula is midline, oropharynx is clear and moist and mucous membranes  are normal. No trismus in the jaw. No uvula swelling. No oropharyngeal exudate, posterior oropharyngeal edema, posterior oropharyngeal erythema or tonsillar abscesses. No tonsillar exudate.  Dental decay noted to tooth #2-3 with TTP but without ginigval erythema and no obvious abscess.   Eyes: Conjunctivae are normal.  Neck: Normal range of motion.  Cardiovascular: Normal rate.   Pulmonary/Chest: Effort normal.  Abdominal: She exhibits no distension.  Musculoskeletal: Normal range of motion.    Neurological: She is alert.  Skin: No pallor.  Psychiatric: She has a normal mood and affect. Her behavior is normal.  Nursing note and vitals reviewed.  ED Treatments / Results  DIAGNOSTIC STUDIES: Oxygen Saturation is 100% on RA, normal by my interpretation.   COORDINATION OF CARE: 7:31 PM-Discussed next steps with pt. Pt verbalized understanding and is agreeable with the plan.   Procedures Procedures   Medications Ordered in ED Medications - No data to display  Initial Impression / Assessment and Plan / ED Course  I have reviewed the triage vital signs and the nursing notes.  Pertinent labs & imaging results that were available during my care of the patient were reviewed by me and considered in my medical decision making (see chart for details).  Clinical Course    Patient with toothache.  No gross abscess.  Exam unconcerning for Ludwig's angina or spread of infection.  Will treat with penicillin and pain medicine.  Urged patient to follow-up with dentist. Pt is comfortable with above plan and is stable for discharge at this time. All questions were answered prior to disposition. Strict return precautions for return into the ED were discussed.   Final Clinical Impressions(s) / ED Diagnoses   Final diagnoses:  Pain due to dental caries   New Prescriptions New Prescriptions   IBUPROFEN (ADVIL,MOTRIN) 800 MG TABLET    Take 1 tablet (800 mg total) by mouth 3 (three) times daily.   PENICILLIN V POTASSIUM (VEETID) 500 MG TABLET    Take 1 tablet (500 mg total) by mouth 3 (three) times daily.   I personally performed the services described in this documentation, which was scribed in my presence. The recorded information has been reviewed and is accurate.       Fayrene HelperBowie Clarrisa Kaylor, PA-C 04/04/16 1936    Alvira MondayErin Schlossman, MD 04/05/16 2131

## 2016-04-04 NOTE — ED Triage Notes (Signed)
Pt describes pain to the right side of her face. Dental pain since this a.m. Has tried OTC meds without relief.

## 2016-04-04 NOTE — ED Notes (Signed)
Pt given d/c instructions as per chart. Rx x 2. Verbalizes understanding. No questions. 

## 2016-04-08 ENCOUNTER — Telehealth (HOSPITAL_BASED_OUTPATIENT_CLINIC_OR_DEPARTMENT_OTHER): Payer: Self-pay | Admitting: *Deleted

## 2016-04-08 NOTE — Telephone Encounter (Signed)
Received call from patient requesting medicine "diflucan" for yeast infection related to recent Pen VK. Spoke with Dr. Frederick Peersachel Little, EDP (current department provider) who suggested OTC Monistat (miconazole). Same information/ directions given to patient over the phone.

## 2016-04-17 ENCOUNTER — Emergency Department (HOSPITAL_COMMUNITY)
Admission: EM | Admit: 2016-04-17 | Discharge: 2016-04-17 | Disposition: A | Discharge status: Home / Self Care | Payer: BLUE CROSS/BLUE SHIELD | Attending: Emergency Medicine | Admitting: Emergency Medicine

## 2016-04-17 ENCOUNTER — Encounter (HOSPITAL_COMMUNITY): Payer: Self-pay | Admitting: *Deleted

## 2016-04-17 DIAGNOSIS — N760 Acute vaginitis: Secondary | ICD-10-CM | POA: Diagnosis not present

## 2016-04-17 DIAGNOSIS — F17210 Nicotine dependence, cigarettes, uncomplicated: Secondary | ICD-10-CM | POA: Insufficient documentation

## 2016-04-17 DIAGNOSIS — B9689 Other specified bacterial agents as the cause of diseases classified elsewhere: Secondary | ICD-10-CM | POA: Insufficient documentation

## 2016-04-17 LAB — WET PREP, GENITAL
Sperm: NONE SEEN
TRICH WET PREP: NONE SEEN
Yeast Wet Prep HPF POC: NONE SEEN

## 2016-04-17 MED ORDER — METRONIDAZOLE 500 MG PO TABS
500.0000 mg | ORAL_TABLET | Freq: Once | ORAL | Status: AC
  Administered 2016-04-17: 500 mg via ORAL
  Filled 2016-04-17: qty 1

## 2016-04-17 MED ORDER — METRONIDAZOLE 500 MG PO TABS: 500.0000 mg | ORAL_TABLET | Freq: Two times a day (BID) | ORAL | 0 refills | Status: DC

## 2016-04-17 NOTE — ED Provider Notes (Signed)
[facility] Provider Note   CSN: [id number] Arrival date & time: 04/17/16  1723     History   Chief Complaint Chief Complaint  Patient presents with  . Vaginitis    HPI Natalie Vega is a 35 y.o. female.  The history is provided by the patient.  Vaginal Itching   Vaginal Discharge   This is a recurrent problem. The current episode started more than 2 days ago. The problem occurs constantly. The problem has not changed since onset.The discharge occurs while at rest. The discharge was white and malodorous. She is not pregnant. Pertinent negatives include no anorexia, no fever, no diarrhea and no nausea. She has tried nothing for the symptoms. Her past medical history does not include PID, STD or infertility.    History reviewed. No pertinent past medical history.  There are no active problems to display for this patient.   Past Surgical History:  Procedure Laterality Date  . TUBAL LIGATION      OB History    No data available       Home Medications    Prior to Admission medications   Medication Sig Start Date End Date Taking? Authorizing Provider  amoxicillin (AMOXIL) 500 MG capsule Take 1 capsule (500 mg total) by mouth 3 (three) times daily. 04/04/14   Robyn M Hess, PA-C  amoxicillin-clavulanate (AUGMENTIN) 875-125 MG per tablet Take 1 tablet by mouth every 12 (twelve) hours. 09/01/14   Kaitlyn Szekalski, PA-C  dextromethorphan-guaiFENesin (MUCINEX DM) 30-600 MG per 12 hr tablet Take 1 tablet by mouth 2 (two) times daily.    Historical Provider, MD  fluconazole (DIFLUCAN) 150 MG tablet Take 1 tablet (150 mg total) by mouth daily. 12/21/15   Heather Laisure, PA-C  fluticasone (FLONASE) 50 MCG/ACT nasal spray Place 2 sprays into both nostrils daily. 04/04/14   Robyn M Hess, PA-C  ibuprofen (ADVIL,MOTRIN) 800 MG tablet Take 1 tablet (800 mg total) by mouth 3 (three) times daily. 04/04/16   Fayrene HelperBowie Tran, PA-C  metroNIDAZOLE (FLAGYL) 500 MG tablet Take 1 tablet  (500 mg total) by mouth 2 (two) times daily. One po bid x 7 days 04/17/16   Marily MemosJason Minnetta Sandora, MD  penicillin v potassium (VEETID) 500 MG tablet Take 1 tablet (500 mg total) by mouth 3 (three) times daily. 04/04/16   Fayrene HelperBowie Tran, PA-C  sodium chloride (OCEAN) 0.65 % SOLN nasal spray Place 1 spray into both nostrils as needed for congestion. 11/27/13   Shon Batonourtney F Horton, MD    Family History No family history on file.  Social History Social History  Substance Use Topics  . Smoking status: Current Every Day Smoker    Packs/day: 0.50    Types: Cigarettes  . Smokeless tobacco: Never Used  . Alcohol use No     Allergies   Patient has no known allergies.   Review of Systems Review of Systems  Constitutional: Negative for fever.  Gastrointestinal: Negative for anorexia, diarrhea and nausea.  Genitourinary: Positive for vaginal discharge.  All other systems reviewed and are negative.    Physical Exam Updated Vital Signs BP 133/82   Pulse 75   Temp 98.5 F (36.9 C) (Oral)   Resp 16   Ht 5\' 6"  (1.676 m)   Wt 177 lb (80.3 kg)   LMP 04/04/2016 (Exact Date)   SpO2 100%   BMI 28.57 kg/m   Physical Exam  Constitutional: She is oriented to person, place, and time. She appears well-developed and well-nourished.  HENT:  Head: Normocephalic and  atraumatic.  Eyes: Conjunctivae and EOM are normal.  Neck: Normal range of motion.  Cardiovascular: Normal rate and regular rhythm.   Pulmonary/Chest: Effort normal. No stridor. No respiratory distress.  Abdominal: Soft. She exhibits no distension. There is no tenderness.  Musculoskeletal: Normal range of motion. She exhibits no edema or deformity.  Neurological: She is alert and oriented to person, place, and time. No cranial nerve deficit. Coordination normal.  Skin: Skin is warm and dry.  Nursing note and vitals reviewed.    ED Treatments / Results  Labs (all labs ordered are listed, but only abnormal results are displayed) Labs  Reviewed  WET PREP, GENITAL - Abnormal; Notable for the following:       Result Value   Clue Cells Wet Prep HPF POC PRESENT (*)    WBC, Wet Prep HPF POC MODERATE (*)    All other components within normal limits    EKG  EKG Interpretation None       Radiology No results found.  Procedures Procedures (including critical care time)  Medications Ordered in ED Medications  metroNIDAZOLE (FLAGYL) tablet 500 mg (500 mg Oral Given 04/17/16 2109)     Initial Impression / Assessment and Plan / ED Course  I have reviewed the triage vital signs and the nursing notes.  Pertinent labs & imaging results that were available during my care of the patient were reviewed by me and considered in my medical decision making (see chart for details).  Clinical Course     Vaginal discharge c/w BV, will start flagyl. Refused pelvic exam, however no pain, fever abdominal TTP so unlikely PID, will treat BV.   Final Clinical Impressions(s) / ED Diagnoses   Final diagnoses:  Bacterial vaginosis    New Prescriptions Discharge Medication List as of 04/17/2016  8:59 PM    START taking these medications   Details  metroNIDAZOLE (FLAGYL) 500 MG tablet Take 1 tablet (500 mg total) by mouth 2 (two) times daily. One po bid x 7 days, Starting Sat 04/17/2016, Print         Marily MemosJason Pamula Luther, MD 04/18/16 1002

## 2016-04-17 NOTE — ED Triage Notes (Signed)
The pt has been placed on penicillin for a tooth infection.  Since yesterday she has had yeast and itching with a vaginal odor  lmp  Nov 12th

## 2016-04-17 NOTE — ED Notes (Signed)
Pt has been on antibiotics for an abscess and now has vaginal itching and discharge-- has taken diflucan x 1 last week-- has had a hx of BV also--

## 2016-08-12 ENCOUNTER — Encounter (HOSPITAL_BASED_OUTPATIENT_CLINIC_OR_DEPARTMENT_OTHER): Payer: Self-pay | Admitting: Emergency Medicine

## 2016-08-12 ENCOUNTER — Emergency Department (HOSPITAL_BASED_OUTPATIENT_CLINIC_OR_DEPARTMENT_OTHER)
Admission: EM | Admit: 2016-08-12 | Discharge: 2016-08-12 | Disposition: A | Discharge status: Home / Self Care | Payer: BLUE CROSS/BLUE SHIELD | Attending: Emergency Medicine | Admitting: Emergency Medicine

## 2016-08-12 DIAGNOSIS — F17210 Nicotine dependence, cigarettes, uncomplicated: Secondary | ICD-10-CM | POA: Diagnosis not present

## 2016-08-12 DIAGNOSIS — K0889 Other specified disorders of teeth and supporting structures: Secondary | ICD-10-CM | POA: Diagnosis present

## 2016-08-12 DIAGNOSIS — K029 Dental caries, unspecified: Secondary | ICD-10-CM | POA: Insufficient documentation

## 2016-08-12 MED ORDER — NAPROXEN 500 MG PO TABS: 500.0000 mg | ORAL_TABLET | Freq: Two times a day (BID) | ORAL | 0 refills | Status: AC

## 2016-08-12 MED ORDER — FLUCONAZOLE 150 MG PO TABS: 150.0000 mg | ORAL_TABLET | Freq: Every day | ORAL | 0 refills | Status: AC

## 2016-08-12 MED ORDER — AMOXICILLIN-POT CLAVULANATE 875-125 MG PO TABS: 1.0000 | ORAL_TABLET | Freq: Two times a day (BID) | ORAL | 0 refills | Status: AC

## 2016-08-12 MED FILL — NAPROXEN 500 MG TABLET: 500 | 7 days supply | Qty: 14 | Fill #0

## 2016-08-12 MED FILL — FLUCONAZOLE 150 MG TABLET: 150 | 1 days supply | Qty: 1 | Fill #0

## 2016-08-12 MED FILL — AMOX-CLAV 875-125 MG TABLET: 875-125 | 7 days supply | Qty: 14 | Fill #0

## 2016-08-12 NOTE — ED Provider Notes (Signed)
[facility]   CSN: [id number] Arrival date & time: 08/12/16  1031     History   Chief Complaint Chief Complaint  Patient presents with  . Dental Pain    HPI Natalie Vega is a 36 y.o. female.  Natalie Vega is a 36 y.o. female presents to clinic with complaint of right upper dental pain onset this morning. Pain is constant, throbbing in nature, no aggravating or alleviating factors. Associated facial swelling. Has tried ibuprofen and Goody Powder with minimal relief. Denies fever, trouble swallowing, shortness of breath, chest pain, abdominal pain, or N/V. H/o dental infections and dental caries, seen in 11/17 for h/o same. Dental insurance started April 1st and hopes to see dentist then for possible tooth extraction.       History reviewed. No pertinent past medical history.  There are no active problems to display for this patient.   Past Surgical History:  Procedure Laterality Date  . TUBAL LIGATION      OB History    No data available       Home Medications    Prior to Admission medications   Medication Sig Start Date End Date Taking? Authorizing Provider  amoxicillin (AMOXIL) 500 MG capsule Take 1 capsule (500 mg total) by mouth 3 (three) times daily. 04/04/14   Kathrynn Speedobyn M Hess, PA-C  amoxicillin-clavulanate (AUGMENTIN) 875-125 MG tablet Take 1 tablet by mouth every 12 (twelve) hours. 08/12/16 08/19/16  Deborha PaymentAshley L Sindhu Nguyen, PA-C  dextromethorphan-guaiFENesin [facility](MUCINEX DM) 30-600 MG per 12 hr tablet Take 1 tablet by mouth 2 (two) times daily.    Historical Provider, MD  fluconazole (DIFLUCAN) 150 MG tablet Take 1 tablet (150 mg total) by mouth daily. 08/12/16 08/13/16  Deborha PaymentAshley L Gray Maugeri, PA-C  fluticasone (FLONASE) 50 MCG/ACT nasal spray Place 2 sprays into both nostrils daily. 04/04/14   Robyn M Hess, PA-C  ibuprofen (ADVIL,MOTRIN) 800 MG tablet Take 1 tablet (800 mg total) by mouth 3 (three) times daily. 04/04/16   Fayrene HelperBowie Tran, PA-C    metroNIDAZOLE (FLAGYL) 500 MG tablet Take 1 tablet (500 mg total) by mouth 2 (two) times daily. One po bid x 7 days 04/17/16   Marily MemosJason Mesner, MD  naproxen (NAPROSYN) 500 MG tablet Take 1 tablet (500 mg total) by mouth 2 (two) times daily with a meal. 08/12/16 08/19/16  Deborha PaymentAshley L Drewey Begue, PA-C  penicillin v potassium (VEETID) 500 MG tablet Take 1 tablet (500 mg total) by mouth 3 (three) times daily. 04/04/16   Fayrene HelperBowie Tran, PA-C  sodium chloride (OCEAN) 0.65 % SOLN nasal spray Place 1 spray into both nostrils as needed for congestion. 11/27/13   Shon Batonourtney F Horton, MD    Family History History reviewed. No pertinent family history.  Social History Social History  Substance Use Topics  . Smoking status: Current Every Day Smoker    Packs/day: 0.50    Types: Cigarettes  . Smokeless tobacco: Never Used  . Alcohol use No     Allergies   Patient has no known allergies.   Review of Systems Review of Systems  Constitutional: Negative for fever.  HENT: Positive for dental problem. Negative for trouble swallowing.   Respiratory: Negative for shortness of breath.   Cardiovascular: Negative for chest pain.  Gastrointestinal: Negative for abdominal pain, nausea and vomiting.  Musculoskeletal: Negative for neck stiffness.  Skin: Negative for rash.     Physical Exam Updated Vital Signs Ht 5\' 6"  (1.676 m)   Wt 68 kg   LMP 08/10/2016  BMI 24.21 kg/m   Physical Exam  Constitutional: She appears well-developed and well-nourished. No distress.  HENT:  Head: Normocephalic and atraumatic.  Mouth/Throat: Uvula is midline, oropharynx is clear and moist and mucous membranes are normal. No trismus in the jaw. Abnormal dentition. Dental caries present. No uvula swelling.  Multiple dental caries and partially edentulous. TTP along right upper gingiva, no obvious abscess appreciated. TTP along tooth #1, 2, 3. No sublingual swelling. No trismus. Uvula midline and rises symmetrically. Managing oral  secretions.   Eyes: Conjunctivae are normal. No scleral icterus.  Neck: Normal range of motion. Neck Vega. Normal range of motion present.  Cardiovascular: Normal rate, regular rhythm and normal heart sounds.   No murmur heard. Pulmonary/Chest: Effort normal and breath sounds normal. No stridor. No respiratory distress.  Abdominal: Soft. There is no tenderness.  Lymphadenopathy:    She has cervical adenopathy.  Mild b/l cervical lymph node tenderness. No appreciable swelling.   Neurological: She is alert.  Skin: Skin is warm and dry. She is not diaphoretic.  Psychiatric: She has a normal mood and affect. Her behavior is normal.     ED Treatments / Results  Labs (all labs ordered are listed, but only abnormal results are displayed) Labs Reviewed - No data to display  EKG  EKG Interpretation None       Radiology No results found.  Procedures Procedures (including critical care time)  Medications Ordered in ED Medications - No data to display   Initial Impression / Assessment and Plan / ED Course  I have reviewed the triage vital signs and the nursing notes.  Pertinent labs & imaging results that were available during my care of the patient were reviewed by me and considered in my medical decision making (see chart for details).     Patient presents to ED with complaint of right upper dental pain and swelling. Denies fever. Patient is non-toxic appearing in NAD. Patient with toothache. TTP of right upper gingiva and TTP of tooth # 1, 2, and 3.  No gross abscess.  Neck ROM intact. No trismus. Uvula midline and rises symmetrically. Managing oral secretions. Exam unconcerning for Ludwig's angina or spread of infection.  Will treat with Augmentin and pain medicine.  Urged patient to follow-up with dentist. ED precautions given. Pt voiced understanding and is agreeable.     Final Clinical Impressions(s) / ED Diagnoses   Final diagnoses:  Pain, dental    New  Prescriptions Discharge Medication List as of 08/12/2016 11:05 AM    START taking these medications   Details  naproxen (NAPROSYN) 500 MG tablet Take 1 tablet (500 mg total) by mouth 2 (two) times daily with a meal., Starting Thu 08/12/2016, Until Thu 08/19/2016, Print         Deborha Payment, PA-C 08/14/16 1610    Jacalyn Lefevre, MD 08/15/16 [phone]

## 2016-08-12 NOTE — Discharge Instructions (Signed)
Read the information below.  You are being prescribed antibiotics for possible dental infection. I have also prescribed naprosyn for pain relief. While taking naprosyn do not take other NSAIDs (Ibuprofen, motrin, aleve). You can also try Tylenol.  Apply cool compresses. Gargle with warm salt water. You may also try OTC Orajel.  Use the prescribed medication as directed.  Please discuss all new medications with your pharmacist.   Please follow up with your dentist as soon as possible for further evaluation and management.  You may return to the Emergency Department at any time for worsening condition or any new symptoms that concern you.

## 2016-08-12 NOTE — ED Triage Notes (Signed)
Pt c/o dental pain since this a.m. Has dental appt scheduled, but insurance does not begin until April.

## 2016-11-04 ENCOUNTER — Encounter (HOSPITAL_BASED_OUTPATIENT_CLINIC_OR_DEPARTMENT_OTHER): Payer: Self-pay | Admitting: *Deleted

## 2016-11-04 ENCOUNTER — Emergency Department (HOSPITAL_BASED_OUTPATIENT_CLINIC_OR_DEPARTMENT_OTHER)
Admission: EM | Admit: 2016-11-04 | Discharge: 2016-11-04 | Disposition: A | Discharge status: Home / Self Care | Payer: BLUE CROSS/BLUE SHIELD | Attending: Emergency Medicine | Admitting: Emergency Medicine

## 2016-11-04 DIAGNOSIS — Z791 Long term (current) use of non-steroidal anti-inflammatories (NSAID): Secondary | ICD-10-CM | POA: Insufficient documentation

## 2016-11-04 DIAGNOSIS — F17210 Nicotine dependence, cigarettes, uncomplicated: Secondary | ICD-10-CM | POA: Diagnosis not present

## 2016-11-04 DIAGNOSIS — J029 Acute pharyngitis, unspecified: Secondary | ICD-10-CM | POA: Insufficient documentation

## 2016-11-04 DIAGNOSIS — Z79899 Other long term (current) drug therapy: Secondary | ICD-10-CM | POA: Diagnosis not present

## 2016-11-04 MED ORDER — FLUCONAZOLE 50 MG PO TABS
150.0000 mg | ORAL_TABLET | Freq: Once | ORAL | Status: AC
  Administered 2016-11-04: 150 mg via ORAL
  Filled 2016-11-04: qty 1

## 2016-11-04 MED ORDER — DEXAMETHASONE 6 MG PO TABS
10.0000 mg | ORAL_TABLET | Freq: Once | ORAL | Status: AC
  Administered 2016-11-04: 10 mg via ORAL
  Filled 2016-11-04: qty 1

## 2016-11-04 MED ORDER — IBUPROFEN 400 MG PO TABS
400.0000 mg | ORAL_TABLET | Freq: Once | ORAL | Status: AC
  Administered 2016-11-04: 400 mg via ORAL
  Filled 2016-11-04: qty 1

## 2016-11-04 MED ORDER — LORATADINE 10 MG PO TABS
10.0000 mg | ORAL_TABLET | Freq: Every day | ORAL | Status: DC
  Administered 2016-11-04: 10 mg via ORAL
  Filled 2016-11-04: qty 1

## 2016-11-04 MED ORDER — LORATADINE 10 MG PO TABS: 10.0000 mg | ORAL_TABLET | Freq: Every day | ORAL | 0 refills | Status: DC

## 2016-11-04 NOTE — ED Provider Notes (Signed)
[facility]   CSN: [id number] Arrival date & time: 11/04/16  1420     History   Chief Complaint Chief Complaint  Patient presents with  . Sore Throat    HPI Natalie Vega is a 36 y.o. female.   Sore Throat  This is a new problem. The current episode started more than 2 days ago. The problem occurs constantly. The problem has been gradually worsening. Associated symptoms include headaches. Pertinent negatives include no chest pain and no shortness of breath. Nothing aggravates the symptoms. Nothing relieves the symptoms. She has tried nothing for the symptoms. The treatment provided no relief.    History reviewed. No pertinent past medical history.  There are no active problems to display for this patient.   Past Surgical History:  Procedure Laterality Date  . TUBAL LIGATION      OB History    No data available       Home Medications    Prior to Admission medications   Medication Sig Start Date End Date Taking? Authorizing Provider  dextromethorphan-guaiFENesin (MUCINEX DM) 30-600 MG per 12 hr tablet Take 1 tablet by mouth 2 (two) times daily.    [provider]  fluticasone (FLONASE) 50 MCG/ACT nasal spray Place 2 sprays into both nostrils daily. 04/04/14   Hess, Nada Boozerobyn M, PA-C  ibuprofen (ADVIL,MOTRIN) 800 MG tablet Take 1 tablet (800 mg total) by mouth 3 (three) times daily. 04/04/16   Fayrene Helperran, Bowie, PA-C  loratadine (CLARITIN) 10 MG tablet Take 1 tablet (10 mg total) by mouth daily. One po daily x 5 days 11/04/16   Elyn Krogh, Barbara CowerJason, MD  sodium chloride (OCEAN) 0.65 % SOLN nasal spray Place 1 spray into both nostrils as needed for congestion. 11/27/13   Horton, Mayer Maskerourtney F, MD    Family History History reviewed. No pertinent family history.  Social History Social History  Substance Use Topics  . Smoking status: Current Every Day Smoker    Packs/day: 1.00    Types: Cigarettes  . Smokeless tobacco: Never Used  . Alcohol use No      Allergies   Patient has no known allergies.   Review of Systems Review of Systems  Respiratory: Negative for shortness of breath.   Cardiovascular: Negative for chest pain.  Neurological: Positive for headaches.  All other systems reviewed and are negative.    Physical Exam Updated Vital Signs BP (!) 145/71 (BP Location: Right Arm)   Pulse 87   Temp 98.5 F (36.9 C) (Oral)   Resp 18   Ht 5\' 5"  (1.651 m)   Wt 78.3 kg (172 lb 9 oz)   LMP 10/28/2016   SpO2 97%   BMI 28.72 kg/m   Physical Exam  Constitutional: She is oriented to person, place, and time. She appears well-developed and well-nourished.  HENT:  Head: Normocephalic and atraumatic.  Frontal Sinus tenderness  Eyes: Conjunctivae and EOM are normal.  Neck: Normal range of motion.  Cardiovascular: Normal rate and regular rhythm.   Pulmonary/Chest: Effort normal and breath sounds normal. No stridor. No respiratory distress. She has no wheezes.  Abdominal: Soft. She exhibits no distension.  Musculoskeletal: Normal range of motion. She exhibits no edema or deformity.  Neurological: She is alert and oriented to person, place, and time. No cranial nerve deficit.  Skin: Skin is warm and dry.  Nursing note and vitals reviewed.    ED Treatments / Results  Labs (all labs ordered are listed, but only abnormal results are displayed) Labs  Reviewed - No data to display  EKG  EKG Interpretation None       Radiology No results found.  Procedures Procedures (including critical care time)  Medications Ordered in ED Medications  fluconazole (DIFLUCAN) tablet 150 mg (not administered)  loratadine (CLARITIN) tablet 10 mg (not administered)  dexamethasone (DECADRON) tablet 10 mg (not administered)  ibuprofen (ADVIL,MOTRIN) tablet 400 mg (not administered)     Initial Impression / Assessment and Plan / ED Course  I have reviewed the triage vital signs and the nursing notes.  Pertinent labs & imaging  results that were available during my care of the patient were reviewed by me and considered in my medical decision making (see chart for details).     Likely viral pharyngitis, will treat symptomatically. No indication for abx as it has only been 4-5 days of symptoms without significant drainage or fever.   Also with symptoms of yeast infection similar to multiple previous yeast infections, asks for diflucan, will provide.   Final Clinical Impressions(s) / ED Diagnoses   Final diagnoses:  Pharyngitis, unspecified etiology    New Prescriptions New Prescriptions   LORATADINE (CLARITIN) 10 MG TABLET    Take 1 tablet (10 mg total) by mouth daily. One po daily x 5 days     Marily Memos, MD 11/04/16 1513

## 2016-11-04 NOTE — ED Triage Notes (Signed)
C/o sore throat x 2 days.

## 2016-11-12 ENCOUNTER — Encounter (HOSPITAL_BASED_OUTPATIENT_CLINIC_OR_DEPARTMENT_OTHER): Payer: Self-pay | Admitting: *Deleted

## 2016-11-12 ENCOUNTER — Emergency Department (HOSPITAL_BASED_OUTPATIENT_CLINIC_OR_DEPARTMENT_OTHER)
Admission: EM | Admit: 2016-11-12 | Discharge: 2016-11-12 | Disposition: A | Discharge status: Home / Self Care | Payer: BLUE CROSS/BLUE SHIELD | Attending: Emergency Medicine | Admitting: Emergency Medicine

## 2016-11-12 DIAGNOSIS — F17210 Nicotine dependence, cigarettes, uncomplicated: Secondary | ICD-10-CM | POA: Insufficient documentation

## 2016-11-12 DIAGNOSIS — N898 Other specified noninflammatory disorders of vagina: Secondary | ICD-10-CM | POA: Diagnosis present

## 2016-11-12 DIAGNOSIS — L739 Follicular disorder, unspecified: Secondary | ICD-10-CM

## 2016-11-12 DIAGNOSIS — L664 Folliculitis ulerythematosa reticulata: Secondary | ICD-10-CM | POA: Insufficient documentation

## 2016-11-12 LAB — URINALYSIS, ROUTINE W REFLEX MICROSCOPIC
Bilirubin Urine: NEGATIVE
GLUCOSE, UA: NEGATIVE mg/dL
HGB URINE DIPSTICK: NEGATIVE
Ketones, ur: NEGATIVE mg/dL
Leukocytes, UA: NEGATIVE
Nitrite: NEGATIVE
Protein, ur: NEGATIVE mg/dL
Specific Gravity, Urine: 1.026 (ref 1.005–1.030)
pH: 5.5 (ref 5.0–8.0)

## 2016-11-12 LAB — PREGNANCY, URINE: Preg Test, Ur: NEGATIVE

## 2016-11-12 MED ORDER — SULFAMETHOXAZOLE-TRIMETHOPRIM 800-160 MG PO TABS: 1.0000 | ORAL_TABLET | Freq: Two times a day (BID) | ORAL | 0 refills | Status: AC

## 2016-11-12 MED ORDER — FLUCONAZOLE 150 MG PO TABS: 150.0000 mg | ORAL_TABLET | Freq: Every day | ORAL | 0 refills | Status: AC

## 2016-11-12 MED FILL — FLUCONAZOLE 150 MG TABLET: 150 | 1 days supply | Qty: 1 | Fill #0

## 2016-11-12 MED FILL — SULFAMETHOXAZOLE/TMP DS TAB: 800-160 | 7 days supply | Qty: 14 | Fill #0

## 2016-11-12 NOTE — ED Triage Notes (Signed)
She thinks she may have BV or a yeast infection.

## 2016-11-12 NOTE — ED Provider Notes (Signed)
[facility]   CSN: [id number] Arrival date & time: 11/12/16  1113     History   Chief Complaint Chief Complaint  Patient presents with  . Vaginal Discharge    HPI Natalie Vega is a 36 y.o. female.  HPI Patient presents to the emergency room for evaluation of a rash in the suprapubic region.  Patient states she has a history of having trouble with folliculitis. In the past she had MRSA. Patient states she was given Bactrim in the past and that helped. She started noticing a rash in her suprapubic region after shaving recently.  She noticed several bumps. They're forming small pustules and there are tender to the touch. Patient denies any trouble with vaginal discharge although she did notice a slight odor the other day. She states she is concerned she may develop a yeast infection though because she going to take antibiotics. Denies any fevers or chills. No dysuria. Patient tried to see her primary doctor and was unable to get an appointment and she needs to go out of town. History reviewed. No pertinent past medical history.  There are no active problems to display for this patient.   Past Surgical History:  Procedure Laterality Date  . TUBAL LIGATION      OB History    No data available       Home Medications    Prior to Admission medications   Medication Sig Start Date End Date Taking? Authorizing Provider  dextromethorphan-guaiFENesin (MUCINEX DM) 30-600 MG per 12 hr tablet Take 1 tablet by mouth 2 (two) times daily.    [provider]  fluconazole (DIFLUCAN) 150 MG tablet Take 1 tablet (150 mg total) by mouth daily. 11/12/16 11/19/16  Linwood Dibbles, MD  fluticasone (FLONASE) 50 MCG/ACT nasal spray Place 2 sprays into both nostrils daily. 04/04/14   Hess, Nada Boozer, PA-C  ibuprofen (ADVIL,MOTRIN) 800 MG tablet Take 1 tablet (800 mg total) by mouth 3 (three) times daily. 04/04/16   Fayrene Helper, PA-C  loratadine (CLARITIN) 10 MG tablet  Take 1 tablet (10 mg total) by mouth daily. One po daily x 5 days 11/04/16   Mesner, Barbara Cower, MD  sodium chloride (OCEAN) 0.65 % SOLN nasal spray Place 1 spray into both nostrils as needed for congestion. 11/27/13   Horton, Mayer Masker, MD  sulfamethoxazole-trimethoprim (BACTRIM DS,SEPTRA DS) 800-160 MG tablet Take 1 tablet by mouth 2 (two) times daily. 11/12/16 11/19/16  Linwood Dibbles, MD    Family History No family history on file.  Social History Social History  Substance Use Topics  . Smoking status: Current Every Day Smoker    Packs/day: 1.00    Types: Cigarettes  . Smokeless tobacco: Never Used  . Alcohol use No     Allergies   Patient has no known allergies.   Review of Systems Review of Systems  All other systems reviewed and are negative.    Physical Exam Updated Vital Signs BP 140/85   Pulse 97   Temp 98.5 F (36.9 C) (Oral)   Resp 18   Ht 1.651 m (5\' 5" )   Wt 78 kg (172 lb)   LMP 10/28/2016   SpO2 100%   BMI 28.62 kg/m   Physical Exam  Constitutional: She appears well-developed and well-nourished. No distress.  HENT:  Head: Normocephalic and atraumatic.  Right Ear: External ear normal.  Left Ear: External ear normal.  Eyes: Conjunctivae are normal. Right eye exhibits no discharge. Left eye exhibits no discharge. No  scleral icterus.  Neck: Neck supple. No tracheal deviation present.  Cardiovascular: Normal rate.   Pulmonary/Chest: Effort normal. No stridor. No respiratory distress.  Abdominal: She exhibits no distension.  Genitourinary:  Genitourinary Comments: Pubic hair has been saved, hair follicles growing back, several areas of erythema and small pustules, no ulcerations  Musculoskeletal: She exhibits no edema.  Neurological: She is alert. Cranial nerve deficit: no gross deficits.  Skin: Skin is warm and dry. No rash noted.  Psychiatric: She has a normal mood and affect.  Nursing note and vitals reviewed.    ED Treatments / Results  Labs (all labs  ordered are listed, but only abnormal results are displayed) Labs Reviewed  URINALYSIS, ROUTINE W REFLEX MICROSCOPIC - Abnormal; Notable for the following:       Result Value   APPearance CLOUDY (*)    All other components within normal limits  PREGNANCY, URINE   Procedures Procedures (including critical care time)  Medications Ordered in ED Medications - No data to display   Initial Impression / Assessment and Plan / ED Course  I have reviewed the triage vital signs and the nursing notes.  Pertinent labs & imaging results that were available during my care of the patient were reviewed by me and considered in my medical decision making (see chart for details).   Consistent with folliculitis.  Will rx bactrim.  DIscussed avoiding shaving. Diflucan to cover for development of yeast infection which she states she often gets with abx  Final Clinical Impressions(s) / ED Diagnoses   Final diagnoses:  Folliculitis    New Prescriptions New Prescriptions   FLUCONAZOLE (DIFLUCAN) 150 MG TABLET    Take 1 tablet (150 mg total) by mouth daily.   SULFAMETHOXAZOLE-TRIMETHOPRIM (BACTRIM DS,SEPTRA DS) 800-160 MG TABLET    Take 1 tablet by mouth 2 (two) times daily.     Linwood DibblesKnapp, Ciarra Braddy, MD 11/12/16 [phone]

## 2017-03-21 ENCOUNTER — Other Ambulatory Visit: Payer: Self-pay

## 2017-03-21 ENCOUNTER — Encounter (HOSPITAL_BASED_OUTPATIENT_CLINIC_OR_DEPARTMENT_OTHER): Payer: Self-pay | Admitting: Emergency Medicine

## 2017-03-21 ENCOUNTER — Emergency Department (HOSPITAL_BASED_OUTPATIENT_CLINIC_OR_DEPARTMENT_OTHER)
Admission: EM | Admit: 2017-03-21 | Discharge: 2017-03-21 | Disposition: A | Discharge status: Home / Self Care | Payer: BLUE CROSS/BLUE SHIELD | Attending: Emergency Medicine | Admitting: Emergency Medicine

## 2017-03-21 DIAGNOSIS — B9689 Other specified bacterial agents as the cause of diseases classified elsewhere: Secondary | ICD-10-CM | POA: Diagnosis not present

## 2017-03-21 DIAGNOSIS — F17210 Nicotine dependence, cigarettes, uncomplicated: Secondary | ICD-10-CM | POA: Diagnosis not present

## 2017-03-21 DIAGNOSIS — Z79899 Other long term (current) drug therapy: Secondary | ICD-10-CM | POA: Diagnosis not present

## 2017-03-21 DIAGNOSIS — N760 Acute vaginitis: Secondary | ICD-10-CM | POA: Insufficient documentation

## 2017-03-21 DIAGNOSIS — R1030 Lower abdominal pain, unspecified: Secondary | ICD-10-CM | POA: Diagnosis present

## 2017-03-21 LAB — URINALYSIS, ROUTINE W REFLEX MICROSCOPIC
BILIRUBIN URINE: NEGATIVE
Glucose, UA: NEGATIVE mg/dL
HGB URINE DIPSTICK: NEGATIVE
Ketones, ur: NEGATIVE mg/dL
Leukocytes, UA: NEGATIVE
Nitrite: NEGATIVE
Protein, ur: NEGATIVE mg/dL
pH: 6 (ref 5.0–8.0)

## 2017-03-21 LAB — WET PREP, GENITAL
Sperm: NONE SEEN
TRICH WET PREP: NONE SEEN
Yeast Wet Prep HPF POC: NONE SEEN

## 2017-03-21 LAB — PREGNANCY, URINE: Preg Test, Ur: NEGATIVE

## 2017-03-21 MED ORDER — FLUCONAZOLE 150 MG PO TABS: 150.0000 mg | ORAL_TABLET | Freq: Once | ORAL | 0 refills | Status: AC

## 2017-03-21 MED ORDER — METRONIDAZOLE 500 MG PO TABS: 500.0000 mg | ORAL_TABLET | Freq: Two times a day (BID) | ORAL | 0 refills | Status: DC

## 2017-03-21 MED FILL — metroNIDAZOLE 500 MG TABS: 500 | 7 days supply | Qty: 14 | Fill #0
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
## 2017-03-21 NOTE — ED Provider Notes (Signed)
[facility] EMERGENCY DEPARTMENT Provider Note   CSN: [id number] Arrival date & time: 03/21/17  1306     History   Chief Complaint Chief Complaint  Patient presents with  . Pelvic Pain    HPI Natalie Vega is a 36 y.o. female.  HPI  36 y.o. female, presents to the Emergency Department today due to lower abdominal pain. Notes symptoms last night. No dysuria. No vaginal bleeding. Notes mild discharge. LMP last week. Pt sexually active with one person. Pt concerned due to her partner possibly cheating on her and wishes to be tested for STDs. Noted hx Trich in past. No fevers. No N/V. No other symptoms noted.   History reviewed. No pertinent past medical history.  There are no active problems to display for this patient.   Past Surgical History:  Procedure Laterality Date  . TUBAL LIGATION      OB History    No data available       Home Medications    Prior to Admission medications   Medication Sig Start Date End Date Taking? Authorizing Provider  dextromethorphan-guaiFENesin (MUCINEX DM) 30-600 MG per 12 hr tablet Take 1 tablet by mouth 2 (two) times daily.    [provider]  fluticasone (FLONASE) 50 MCG/ACT nasal spray Place 2 sprays into both nostrils daily. 04/04/14   Hess, Nada Boozerobyn M, PA-C  ibuprofen (ADVIL,MOTRIN) 800 MG tablet Take 1 tablet (800 mg total) by mouth 3 (three) times daily. 04/04/16   Fayrene Helperran, Bowie, PA-C  loratadine (CLARITIN) 10 MG tablet Take 1 tablet (10 mg total) by mouth daily. One po daily x 5 days 11/04/16   Mesner, Barbara CowerJason, MD  sodium chloride (OCEAN) 0.65 % SOLN nasal spray Place 1 spray into both nostrils as needed for congestion. 11/27/13   Horton, Mayer Maskerourtney F, MD    Family History History reviewed. No pertinent family history.  Social History Social History   Tobacco Use  . Smoking status: Current Every Day Smoker    Packs/day: 1.00    Types: Cigarettes  . Smokeless tobacco: Never Used  Substance Use Topics  .  Alcohol use: No  . Drug use: No     Allergies   Patient has no known allergies.   Review of Systems Review of Systems ROS reviewed and all are negative for acute change except as noted in the HPI.  Physical Exam Updated Vital Signs BP (!) 148/101 (BP Location: Left Arm)   Pulse 90   Temp 98.6 F (37 C) (Oral)   Resp 16   Ht 5\' 6"  (1.676 m)   Wt 74.8 kg (165 lb)   LMP 02/28/2017 (Approximate)   SpO2 100%   BMI 26.63 kg/m   Physical Exam  Constitutional: She is oriented to person, place, and time. Vital signs are normal. She appears well-developed and well-nourished.  HENT:  Head: Normocephalic and atraumatic.  Right Ear: Hearing normal.  Left Ear: Hearing normal.  Eyes: Conjunctivae and EOM are normal. Pupils are equal, round, and reactive to light.  Cardiovascular: Normal rate and regular rhythm.  Pulmonary/Chest: Effort normal.  Abdominal: Soft. There is no tenderness.  Neurological: She is alert and oriented to person, place, and time.  Skin: Skin is warm and dry.  Psychiatric: She has a normal mood and affect. Her speech is normal and behavior is normal. Thought content normal.  Nursing note and vitals reviewed.  Exam performed by Eston Estersyler M Summerlyn Fickel,  exam chaperoned Date: 03/21/2017 Pelvic exam: normal external genitalia without evidence of  trauma. VULVA: normal appearing vulva with no masses, tenderness or lesion. VAGINA: normal appearing vagina with normal color and discharge, no lesions. CERVIX: normal appearing cervix without lesions, cervical motion tenderness absent, cervical os closed with out purulent discharge; vaginal discharge - clear and bloody, Wet prep and DNA probe for chlamydia and GC obtained.   ADNEXA: normal adnexa in size, nontender and no masses UTERUS: uterus is normal size, shape, consistency and nontender.    ED Treatments / Results  Labs (all labs ordered are listed, but only abnormal results are displayed) Labs Reviewed  WET PREP,  GENITAL - Abnormal; Notable for the following components:      Result Value   Clue Cells Wet Prep HPF POC PRESENT (*)    WBC, Wet Prep HPF POC MODERATE (*)    All other components within normal limits  URINALYSIS, ROUTINE W REFLEX MICROSCOPIC - Abnormal; Notable for the following components:   Specific Gravity, Urine <1.005 (*)    All other components within normal limits  PREGNANCY, URINE  RPR  HIV ANTIBODY (ROUTINE TESTING)  GC/CHLAMYDIA PROBE AMP ([facility]) NOT AT [facility]    EKG  EKG Interpretation None       Radiology No results found.  Procedures Procedures (including critical care time)  Medications Ordered in ED Medications - No data to display   Initial Impression / Assessment and Plan / ED Course  I have reviewed the triage vital signs and the nursing notes.  Pertinent labs & imaging results that were available during my care of the patient were reviewed by me and considered in my medical decision making (see chart for details).  Final Clinical Impressions(s) / ED Diagnoses  {I have reviewed and evaluated the relevant laboratory values.   {I have reviewed the relevant previous healthcare records.  {I obtained HPI from historian.   ED Course:  Assessment: Pt is a 36 y.o. female presents to the Emergency Department today due to lower abdominal pain. Notes symptoms last night. No dysuria. No vaginal bleeding. Notes mild discharge. LMP last week. Pt sexually active with one person. Pt concerned due to her partner possibly cheating on her and wishes to be tested for STDs. Noted hx Trich in past. No fevers. No N/V. On exam, pt in NAD. Nontoxic/nonseptic appearing. VSS. Afebrile. Lungs CTA. Heart RRR. Abdomen nontender soft. GU Exam unremarkable. Mild spotting from cervix. No Adnexal. No CMT. Wet prep shows BV. Given Rx Flagyl. GC obtained. UA unremarkable. Pregnancy negative. Plan is to DC home with follow up to PCP. PT declined prophylactic ED Tx for GC. At time of  discharge, Patient is in no acute distress. Vital Signs are stable. Patient is able to ambulate. Patient able to tolerate PO.   Disposition/Plan:  DC Home Additional Verbal discharge instructions given and discussed with patient.  Pt Instructed to f/u with PCP in the next week for evaluation and treatment of symptoms. Return precautions given Pt acknowledges and agrees with plan  Supervising Physician Tilden Fossa, MD  Final diagnoses:  BV (bacterial vaginosis)    ED Discharge Orders    None       Audry Pili, PA-C 03/21/17 1446    Tilden Fossa, MD 03/22/17 [phone]
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
## 2017-03-22 LAB — RPR: RPR Ser Ql: NONREACTIVE

## 2017-03-22 LAB — GC/CHLAMYDIA PROBE AMP (~~LOC~~) NOT AT ARMC
Chlamydia: NEGATIVE
Neisseria Gonorrhea: NEGATIVE

## 2017-03-22 LAB — HIV ANTIBODY (ROUTINE TESTING W REFLEX): HIV SCREEN 4TH GENERATION: NONREACTIVE

## 2017-10-24 ENCOUNTER — Other Ambulatory Visit: Payer: Self-pay

## 2017-10-24 ENCOUNTER — Emergency Department (HOSPITAL_BASED_OUTPATIENT_CLINIC_OR_DEPARTMENT_OTHER)
Admission: EM | Admit: 2017-10-24 | Discharge: 2017-10-24 | Disposition: A | Discharge status: Home / Self Care | Payer: BLUE CROSS/BLUE SHIELD | Attending: Emergency Medicine | Admitting: Emergency Medicine

## 2017-10-24 ENCOUNTER — Encounter (HOSPITAL_BASED_OUTPATIENT_CLINIC_OR_DEPARTMENT_OTHER): Payer: Self-pay

## 2017-10-24 DIAGNOSIS — M542 Cervicalgia: Secondary | ICD-10-CM | POA: Insufficient documentation

## 2017-10-24 DIAGNOSIS — F17210 Nicotine dependence, cigarettes, uncomplicated: Secondary | ICD-10-CM | POA: Insufficient documentation

## 2017-10-24 MED ORDER — NAPROXEN 500 MG PO TABS: 500.0000 mg | ORAL_TABLET | Freq: Two times a day (BID) | ORAL | 0 refills | Status: DC

## 2017-10-24 MED ORDER — METHOCARBAMOL 500 MG PO TABS: 500.0000 mg | ORAL_TABLET | Freq: Every evening | ORAL | 0 refills | Status: DC | PRN
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
## 2018-06-04 ENCOUNTER — Emergency Department (HOSPITAL_BASED_OUTPATIENT_CLINIC_OR_DEPARTMENT_OTHER)
Admission: EM | Admit: 2018-06-04 | Discharge: 2018-06-04 | Disposition: A | Discharge status: Home / Self Care | Payer: Managed Care, Other (non HMO) | Attending: Emergency Medicine | Admitting: Emergency Medicine

## 2018-06-04 ENCOUNTER — Encounter (HOSPITAL_BASED_OUTPATIENT_CLINIC_OR_DEPARTMENT_OTHER): Payer: Self-pay | Admitting: *Deleted

## 2018-06-04 ENCOUNTER — Other Ambulatory Visit: Payer: Self-pay

## 2018-06-04 DIAGNOSIS — R21 Rash and other nonspecific skin eruption: Secondary | ICD-10-CM | POA: Diagnosis present

## 2018-06-04 DIAGNOSIS — F17210 Nicotine dependence, cigarettes, uncomplicated: Secondary | ICD-10-CM | POA: Diagnosis not present

## 2018-06-04 DIAGNOSIS — L739 Follicular disorder, unspecified: Secondary | ICD-10-CM | POA: Insufficient documentation

## 2018-06-04 MED ORDER — SULFAMETHOXAZOLE-TRIMETHOPRIM 800-160 MG PO TABS: 1.0000 | ORAL_TABLET | Freq: Two times a day (BID) | ORAL | 0 refills | Status: AC

## 2018-06-04 MED ORDER — FLUCONAZOLE 150 MG PO TABS: 150.0000 mg | ORAL_TABLET | Freq: Every day | ORAL | 0 refills | Status: DC

## 2018-06-04 NOTE — ED Provider Notes (Signed)
[facility] EMERGENCY DEPARTMENT Provider Note   CSN: [id number] Arrival date & time: 06/04/18  1542     History   Chief Complaint Chief Complaint  Patient presents with  . Rash    HPI Natalie Vega is a 38 y.o. female.  Patient with painful rash noted to the vaginal area after shaving 2 days ago.  Symptoms are similar to when she has had folliculitis in the past.  She has been treated with Bactrim which has helped.  She denies any fevers, nausea or vomiting.  No drainage from the area.  No vaginal discharge or urinary symptoms.  Onset of symptoms acute.  Course is gradual worsening.  Nothing makes symptoms better or worse.     History reviewed. No pertinent past medical history.  There are no active problems to display for this patient.   Past Surgical History:  Procedure Laterality Date  . TUBAL LIGATION    . TUBAL LIGATION       OB History   No obstetric history on file.      Home Medications    Prior to Admission medications   Medication Sig Start Date End Date Taking? Authorizing Provider  fluconazole (DIFLUCAN) 150 MG tablet Take 1 tablet (150 mg total) by mouth daily. 06/04/18   Renne Crigler, PA-C  methocarbamol (ROBAXIN) 500 MG tablet Take 1 tablet (500 mg total) by mouth at bedtime as needed for muscle spasms. 10/24/17   Maczis, Elmer Sow, PA-C  naproxen (NAPROSYN) 500 MG tablet Take 1 tablet (500 mg total) by mouth 2 (two) times daily. 10/24/17   Maczis, Elmer Sow, PA-C  sulfamethoxazole-trimethoprim (BACTRIM DS,SEPTRA DS) 800-160 MG tablet Take 1 tablet by mouth 2 (two) times daily for 7 days. 06/04/18 06/11/18  Renne Crigler, PA-C    Family History No family history on file.  Social History Social History   Tobacco Use  . Smoking status: Current Every Day Smoker    Packs/day: 1.00    Types: Cigarettes  . Smokeless tobacco: Never Used  Substance Use Topics  . Alcohol use: Yes    Comment: 2x month  . Drug use: No      Allergies   Patient has no known allergies.   Review of Systems Review of Systems  Constitutional: Negative for fever.  Gastrointestinal: Negative for nausea and vomiting.  Genitourinary: Negative for dysuria.  Skin: Positive for rash.     Physical Exam Updated Vital Signs BP (!) 142/84 (BP Location: Left Arm)   Pulse 85   Temp 98.7 F (37.1 C) (Oral)   Resp 18   Ht 5\' 6"  (1.676 m)   Wt 72.6 kg   LMP 05/17/2018   SpO2 100%   BMI 25.82 kg/m   Physical Exam Vitals signs and nursing note reviewed. Exam conducted with a chaperone present.  Constitutional:      Appearance: She is well-developed.  HENT:     Head: Normocephalic and atraumatic.  Eyes:     Conjunctiva/sclera: Conjunctivae normal.  Neck:     Musculoskeletal: Normal range of motion and neck supple.  Pulmonary:     Effort: No respiratory distress.  Skin:    General: Skin is warm and dry.     Comments: There is irregular area of erythema in the pubic area with small early pustules consistent with folliculitis.  No gross abscess.  Neurological:     Mental Status: She is alert.      ED Treatments / Results  Labs (all labs ordered are  listed, but only abnormal results are displayed) Labs Reviewed - No data to display  EKG None  Radiology No results found.  Procedures Procedures (including critical care time)  Medications Ordered in ED Medications - No data to display   Initial Impression / Assessment and Plan / ED Course  I have reviewed the triage vital signs and the nursing notes.  Pertinent labs & imaging results that were available during my care of the patient were reviewed by me and considered in my medical decision making (see chart for details).     Patient seen and examined.  Exam consistent with folliculitis.  Patient will be given course of antibiotics.  Also discussed use of topical antibiotics.  She is given a prescription for Diflucan because she typically will develop  yeast infection with antibiotics.  Vital signs reviewed and are as follows: BP (!) 142/84 (BP Location: Left Arm)   Pulse 85   Temp 98.7 F (37.1 C) (Oral)   Resp 18   Ht 5\' 6"  (1.676 m)   Wt 72.6 kg   LMP 05/17/2018   SpO2 100%   BMI 25.82 kg/m     Final Clinical Impressions(s) / ED Diagnoses   Final diagnoses:  Folliculitis   Patient with folliculitis starting after shaving.  No drainable abscess.  No systemic symptoms of illness.  Treatment as above.  ED Discharge Orders         Ordered    sulfamethoxazole-trimethoprim (BACTRIM DS,SEPTRA DS) 800-160 MG tablet  2 times daily     06/04/18 1633    fluconazole (DIFLUCAN) 150 MG tablet  Daily     06/04/18 1633           Renne Crigler, PA-C 06/04/18 1638    Rolan Bucco, MD 06/04/18 1639

## 2018-06-04 NOTE — Discharge Instructions (Signed)
Please read and follow all provided instructions.  Your diagnoses today include:  1. Folliculitis     Tests performed today include:  Vital signs. See below for your results today.   Medications prescribed:   Bactrim (trimethoprim/sulfamethoxazole) - antibiotic  You have been prescribed an antibiotic medicine: take the entire course of medicine even if you are feeling better. Stopping early can cause the antibiotic not to work.   Diflucan - medication for yeast infection  Take any prescribed medications only as directed.   Home care instructions:  Follow any educational materials contained in this packet. Keep affected area above the level of your heart when possible. Wash area gently twice a day with warm soapy water. Do not apply alcohol or hydrogen peroxide. Cover the area if it draining or weeping.   Follow-up instructions: Please return if symptoms worsen.   Return instructions:  Return to the Emergency Department if you have:  Fever  Worsening symptoms  Worsening pain  Worsening swelling  Redness of the skin that moves away from the affected area, especially if it streaks away from the affected area   Any other emergent concerns  Your vital signs today were: BP (!) 142/84 (BP Location: Left Arm)    Pulse 85    Temp 98.7 F (37.1 C) (Oral)    Resp 18    Ht 5\' 6"  (1.676 m)    Wt 72.6 kg    LMP 05/17/2018    SpO2 100%    BMI 25.82 kg/m  If your blood pressure (BP) was elevated above 135/85 this visit, please have this repeated by your doctor within one month. --------------

## 2018-06-04 NOTE — ED Triage Notes (Signed)
Pt reports she shaved her vaginal area Friday, noticed rash last night

## 2021-04-30 ENCOUNTER — Emergency Department (HOSPITAL_BASED_OUTPATIENT_CLINIC_OR_DEPARTMENT_OTHER)
Admission: EM | Admit: 2021-04-30 | Discharge: 2021-05-01 | Disposition: A | Discharge status: Home / Self Care | Payer: Self-pay | Attending: Emergency Medicine | Admitting: Emergency Medicine

## 2021-04-30 ENCOUNTER — Encounter (HOSPITAL_BASED_OUTPATIENT_CLINIC_OR_DEPARTMENT_OTHER): Payer: Self-pay

## 2021-04-30 ENCOUNTER — Emergency Department (HOSPITAL_BASED_OUTPATIENT_CLINIC_OR_DEPARTMENT_OTHER): Payer: Self-pay

## 2021-04-30 ENCOUNTER — Other Ambulatory Visit: Payer: Self-pay

## 2021-04-30 DIAGNOSIS — Z87891 Personal history of nicotine dependence: Secondary | ICD-10-CM | POA: Insufficient documentation

## 2021-04-30 DIAGNOSIS — R300 Dysuria: Secondary | ICD-10-CM | POA: Insufficient documentation

## 2021-04-30 DIAGNOSIS — R102 Pelvic and perineal pain: Secondary | ICD-10-CM | POA: Insufficient documentation

## 2021-04-30 LAB — WET PREP, GENITAL
Clue Cells Wet Prep HPF POC: NONE SEEN
Sperm: NONE SEEN
Trich, Wet Prep: NONE SEEN
WBC, Wet Prep HPF POC: >=10 — AB (ref <10)
Yeast Wet Prep HPF POC: NONE SEEN

## 2021-04-30 LAB — URINALYSIS, MICROSCOPIC (REFLEX)

## 2021-04-30 LAB — URINALYSIS, ROUTINE W REFLEX MICROSCOPIC
Bilirubin Urine: NEGATIVE
Glucose, UA: NEGATIVE mg/dL
Hgb urine dipstick: NEGATIVE
Ketones, ur: NEGATIVE mg/dL
Nitrite: NEGATIVE
Protein, ur: NEGATIVE mg/dL
Specific Gravity, Urine: 1.02 (ref 1.005–1.030)
pH: 7 (ref 5.0–8.0)

## 2021-04-30 LAB — PREGNANCY, URINE: Preg Test, Ur: NEGATIVE

## 2021-04-30 NOTE — ED Triage Notes (Signed)
Pt c/o vaginal d/c, dysuria x today-NAD-steady gait

## 2021-04-30 NOTE — ED Provider Notes (Signed)
[facility] Provider Note: Natalie Dell, MD, FACEP  CSN: [id number] MRN: [id number] ARRIVAL: 04/30/21 at 2152 ROOM: [facility]12/[facility]12   CHIEF COMPLAINT  Pelvic Discomfort   HISTORY OF PRESENT ILLNESS  04/30/21 10:50 PM Natalie Vega is a 40 y.o. female with vaguely described pelvic discomfort that began today.  She rates her discomfort as a 3 out of 10.  It is worse with movement.  She is equivocal about pain with urination.  She has noticed a vaginal discharge which is not profuse.    History reviewed. No pertinent past medical history.  Past Surgical History:  Procedure Laterality Date   TUBAL LIGATION      No family history on file.  Social History   Tobacco Use   Smoking status: Former    Packs/day: 1.00    Types: Cigarettes   Smokeless tobacco: Never  Vaping Use   Vaping Use: Every day  Substance Use Topics   Alcohol use: Yes    Comment: occ   Drug use: No    Prior to Admission medications   Not on File    Allergies Patient has no known allergies.   REVIEW OF SYSTEMS  Negative except as noted here or in the History of Present Illness.   PHYSICAL EXAMINATION  Initial Vital Signs Blood pressure (!) 153/90, pulse 74, temperature 98.3 F (36.8 C), temperature source Oral, resp. rate 16, height 5\' 5"  (1.651 m), weight 79.4 kg, last menstrual period 04/15/2021, SpO2 100 %.  Examination General: Well-developed, well-nourished female in no acute distress; appearance consistent with age of record HENT: normocephalic; appearance Eyes: Normal appearing Neck: supple Heart: regular rate and rhythm Lungs: clear to auscultation bilaterally Abdomen: soft; nondistended; mild left suprapubic tenderness; bowel sounds present GU: Normal external genitalia; no significant vaginal discharge; no vaginal bleeding; no cervical motion tenderness; mild left adnexal tenderness Extremities: No deformity; full range of motion; pulses normal Neurologic: Awake,  alert and oriented; motor function intact in all extremities and symmetric; no facial droop Skin: Warm and dry Psychiatric: Normal mood and affect   RESULTS  Summary of this visit's results, reviewed and interpreted by myself:   EKG Interpretation  Date/Time:    Ventricular Rate:    PR Interval:    QRS Duration:   QT Interval:    QTC Calculation:   R Axis:     Text Interpretation:         Laboratory Studies: Results for orders placed or performed during the hospital encounter of 04/30/21 (from the past 24 hour(s))  Pregnancy, urine     Status: None   Collection Time: 04/30/21 10:10 PM  Result Value Ref Range   Preg Test, Ur NEGATIVE NEGATIVE  Urinalysis, Routine w reflex microscopic Urine, Clean Catch     Status: Abnormal   Collection Time: 04/30/21 10:10 PM  Result Value Ref Range   Color, Urine YELLOW YELLOW   APPearance CLEAR CLEAR   Specific Gravity, Urine 1.020 1.005 - 1.030   pH 7.0 5.0 - 8.0   Glucose, UA NEGATIVE NEGATIVE mg/dL   Hgb urine dipstick NEGATIVE NEGATIVE   Bilirubin Urine NEGATIVE NEGATIVE   Ketones, ur NEGATIVE NEGATIVE mg/dL   Protein, ur NEGATIVE NEGATIVE mg/dL   Nitrite NEGATIVE NEGATIVE   Leukocytes,Ua TRACE (A) NEGATIVE  Urinalysis, Microscopic (reflex)     Status: Abnormal   Collection Time: 04/30/21 10:10 PM  Result Value Ref Range   RBC / HPF 0-5 0 - 5 RBC/hpf   WBC, UA 0-5 0 -  5 WBC/hpf   Bacteria, UA FEW (A) NONE SEEN   Squamous Epithelial / LPF 0-5 0 - 5   Mucus PRESENT   Wet prep, genital     Status: Abnormal   Collection Time: 04/30/21 10:55 PM   Specimen: Cervix; Genital  Result Value Ref Range   Yeast Wet Prep HPF POC NONE SEEN NONE SEEN   Trich, Wet Prep NONE SEEN NONE SEEN   Clue Cells Wet Prep HPF POC NONE SEEN NONE SEEN   WBC, Wet Prep HPF POC >=10 (A) <10   Sperm NONE SEEN    Imaging Studies: US PELVIS (TRANSABDOMINAL ONLY)  Result Date: 05/01/2021 CLINICAL DATA:  Pelvic EXAM: TRANSABDOMINAL ULTRASOUND OF PELVIS  DOPPLER ULTRASOUND OF OVARIES TECHNIQUE: Transabdominal ultrasound examination of the pelvis was performed including evaluation of the uterus, ovaries, adnexal regions, and pelvic cul-de-sac. Color and duplex Doppler ultrasound was utilized to evaluate blood flow to the ovaries. COMPARISON:  None. FINDINGS: Uterus Measurements: 9.1 x 4.7 x 6.7 cm = volume: 151 mL. No fibroids or other mass visualized. Endometrium Thickness: 10 mm.  No focal abnormality visualized. Right ovary Measurements: 3.7 x 2.1 x 4.9 cm = volume: 7.5 mL. Normal appearance/no adnexal mass. Left ovary Measurements: 3.1 x 2.5 x 3.6 cm = volume: 14.9 mL. Normal appearance/no adnexal mass. Pulsed Doppler evaluation demonstrates normal low-resistance arterial and venous waveforms in both ovaries. Other: Trace free fluid in the pelvis. IMPRESSION: Unremarkable pelvic ultrasound. Electronically Signed   By: Elgie Collard M.D.   On: 05/01/2021 01:09   US PELVIC DOPPLER (TORSION R/O OR MASS ARTERIAL FLOW)  Result Date: 05/01/2021 CLINICAL DATA:  Pelvic EXAM: TRANSABDOMINAL ULTRASOUND OF PELVIS DOPPLER ULTRASOUND OF OVARIES TECHNIQUE: Transabdominal ultrasound examination of the pelvis was performed including evaluation of the uterus, ovaries, adnexal regions, and pelvic cul-de-sac. Color and duplex Doppler ultrasound was utilized to evaluate blood flow to the ovaries. COMPARISON:  None. FINDINGS: Uterus Measurements: 9.1 x 4.7 x 6.7 cm = volume: 151 mL. No fibroids or other mass visualized. Endometrium Thickness: 10 mm.  No focal abnormality visualized. Right ovary Measurements: 3.7 x 2.1 x 4.9 cm = volume: 7.5 mL. Normal appearance/no adnexal mass. Left ovary Measurements: 3.1 x 2.5 x 3.6 cm = volume: 14.9 mL. Normal appearance/no adnexal mass. Pulsed Doppler evaluation demonstrates normal low-resistance arterial and venous waveforms in both ovaries. Other: Trace free fluid in the pelvis. IMPRESSION: Unremarkable pelvic ultrasound.  Electronically Signed   By: Elgie Collard M.D.   On: 05/01/2021 01:09    ED COURSE and MDM  Nursing notes, initial and subsequent vitals signs, including pulse oximetry, reviewed and interpreted by myself.  Vitals:   04/30/21 2203 04/30/21 2210  BP: (!) 153/90   Pulse: 74   Resp: 16   Temp: 98.3 F (36.8 C)   TempSrc: Oral   SpO2: 100%   Weight:  79.4 kg  Height:  5\' 5"  (1.651 m)   Medications - No data to display  Patient advised of reassuring work-up.  The cause of her discomfort is not clear at this time and she was advised to return if symptoms worsen.  GC and Chlamydia are pending.  PROCEDURES  Procedures   ED DIAGNOSES     ICD-10-CM   1. Pelvic pain  R10.2 PELVIS (TRANSABDOMINAL ONLY)    US PELVIS (TRANSABDOMINAL ONLY)    US PELVIC DOPPLER (TORSION R/O OR MASS ARTERIAL FLOW)    US PELVIC DOPPLER (TORSION R/O OR MASS ARTERIAL FLOW)    CANCELED:  US PELVIC COMPLETE WITH TRANSVAGINAL    CANCELED: US PELVIC COMPLETE WITH TRANSVAGINAL    CANCELED: US PELVIC COMPLETE W TRANSVAGINAL AND TORSION R/O    CANCELED: US PELVIC COMPLETE W TRANSVAGINAL AND TORSION R/O         Latroy Gaymon, MD 05/01/21 [phone]

## 2021-05-04 LAB — GC/CHLAMYDIA PROBE AMP (~~LOC~~) NOT AT ARMC
Chlamydia: NEGATIVE
Comment: NEGATIVE
Comment: NORMAL
Neisseria Gonorrhea: NEGATIVE

## 2022-08-14 IMAGING — US US PELVIS COMPLETE
1 series · 14 of 25 positions shown · non-contrast
Comparison: None.

CLINICAL DATA: Pelvic

EXAM:
TRANSABDOMINAL ULTRASOUND OF PELVIS
DOPPLER ULTRASOUND OF OVARIES
TECHNIQUE: Transabdominal ultrasound examination of the pelvis was performed
including evaluation of the uterus, ovaries, adnexal regions, and
pelvic cul-de-sac.
Color and duplex Doppler ultrasound was utilized to evaluate blood
flow to the ovaries.

[Series 1: us pelvis complete · 62 acquisitions, 14 frames shown]
[im 1/62]
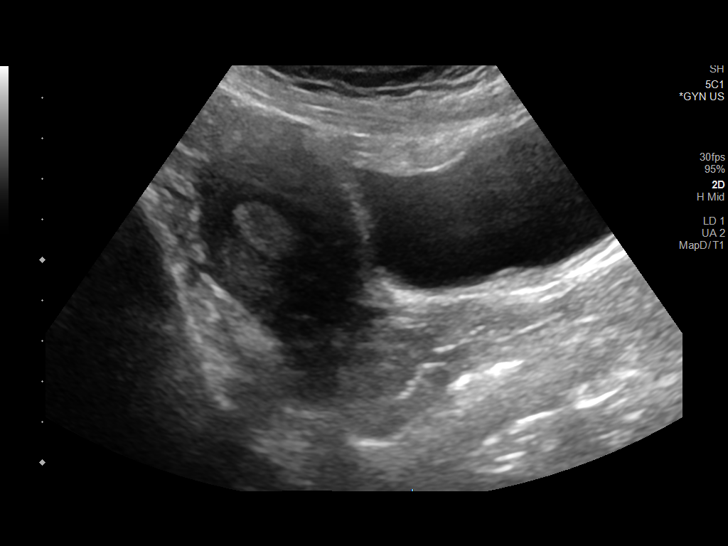
[im 6/62]
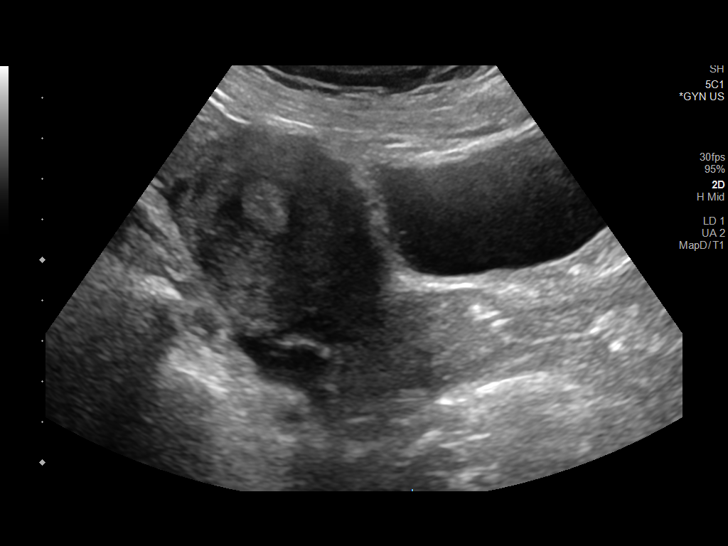
[im 11/62]
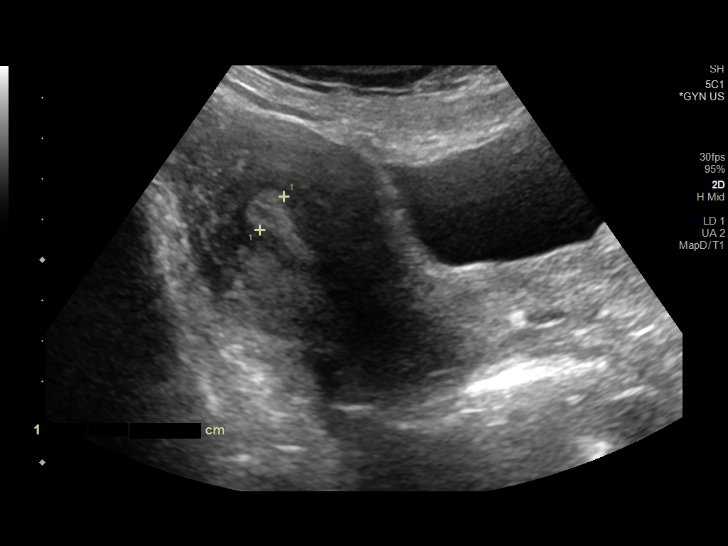
[im 16/62]
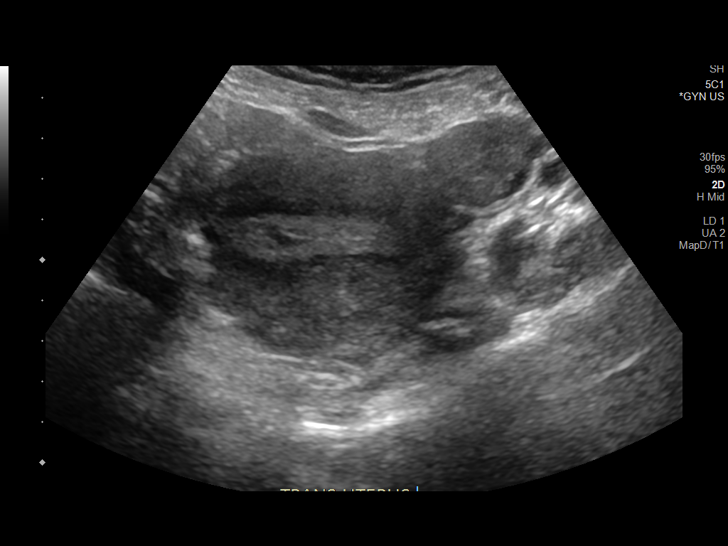
[im 21/62]
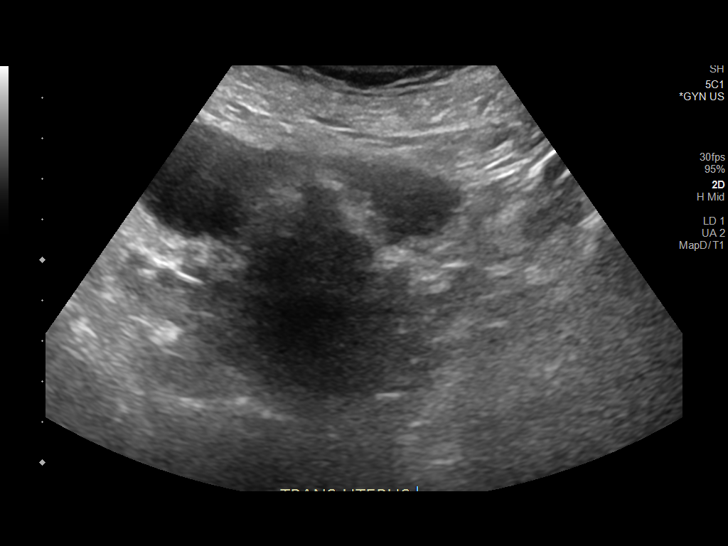
[im 23/62]
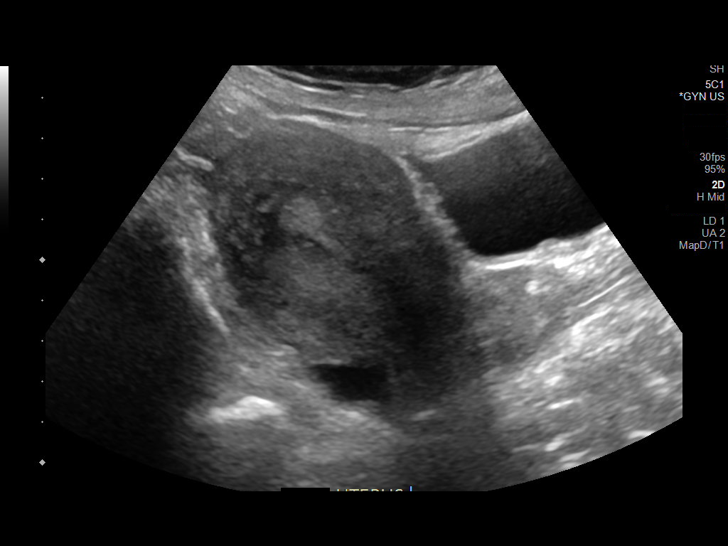
[im 28/62]
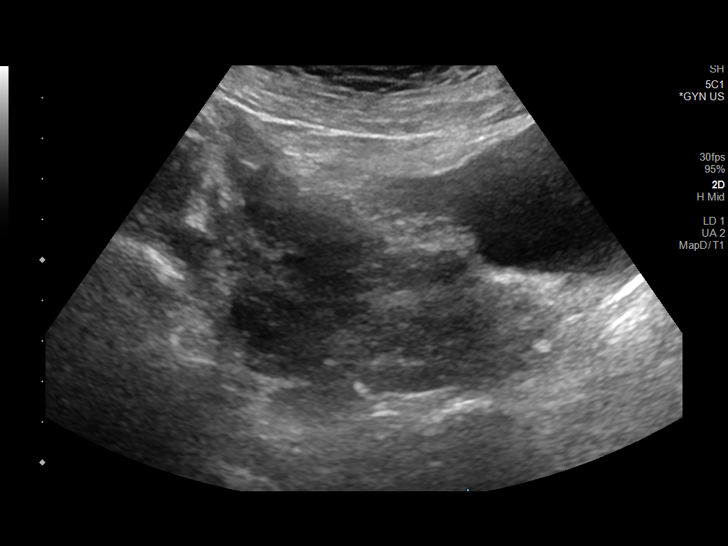
[im 34/62]
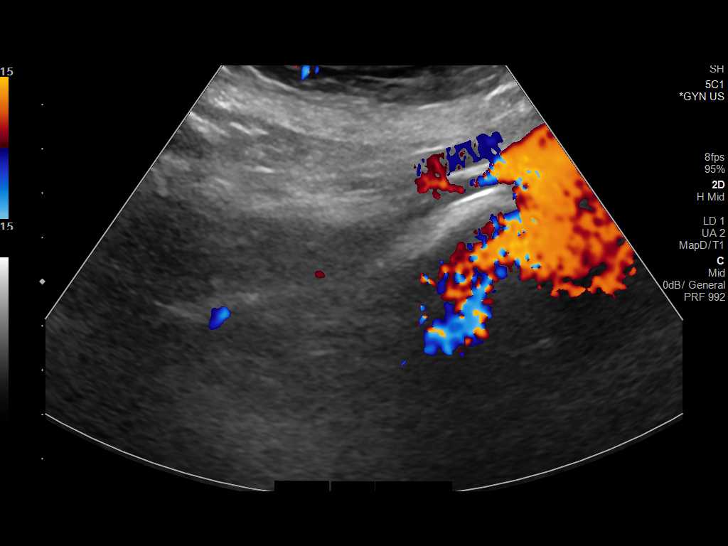
[im 39/62]
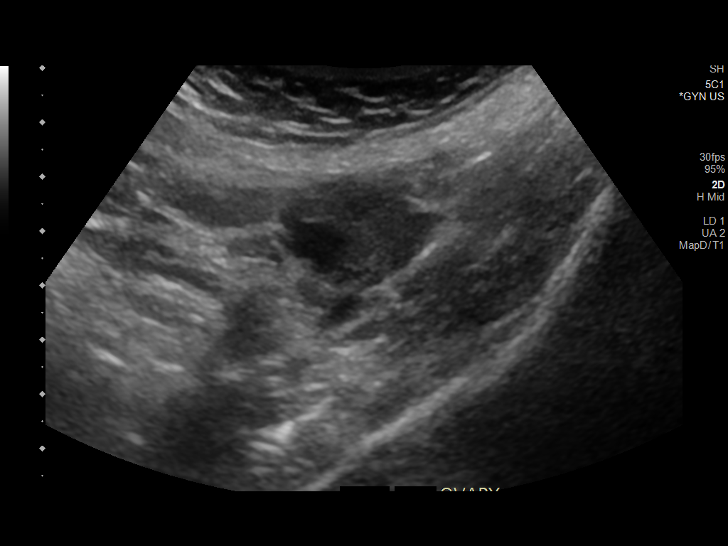
[im 41/62]
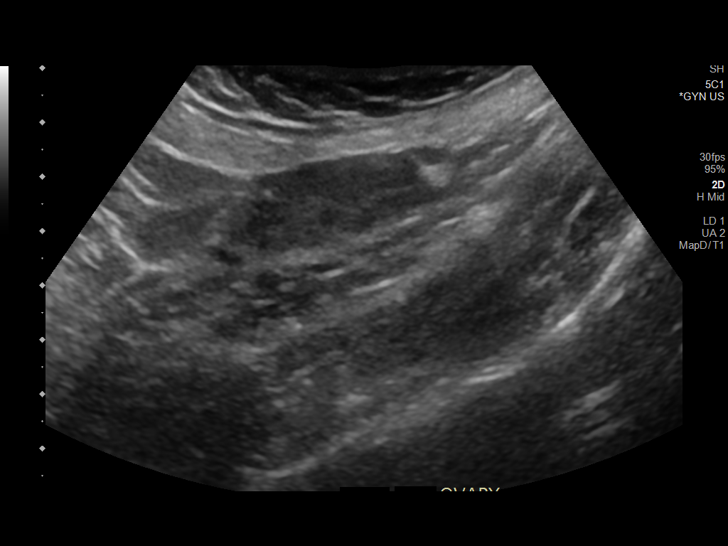
[im 46/62]
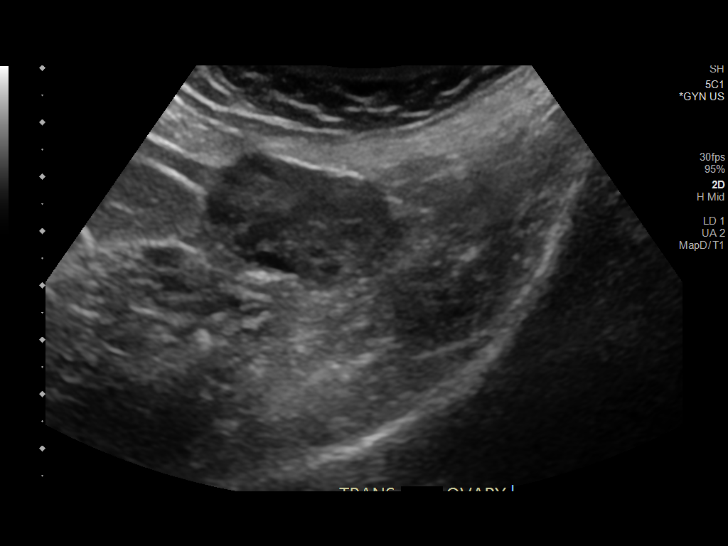
[im 51/62]
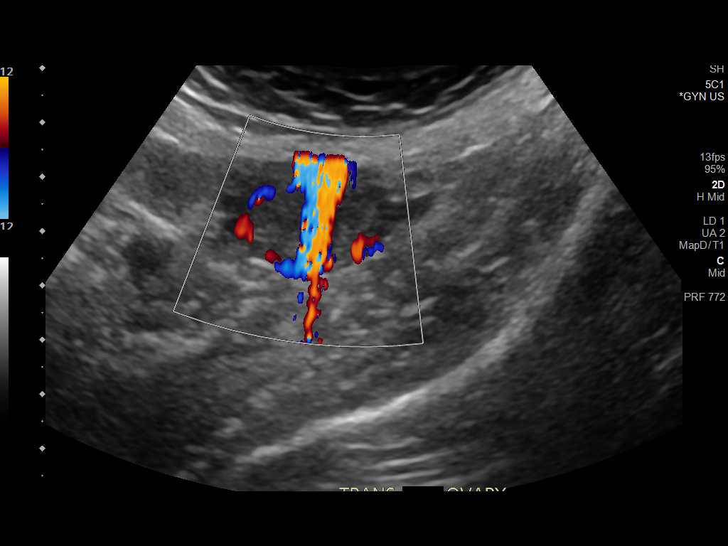
[im 56/62]
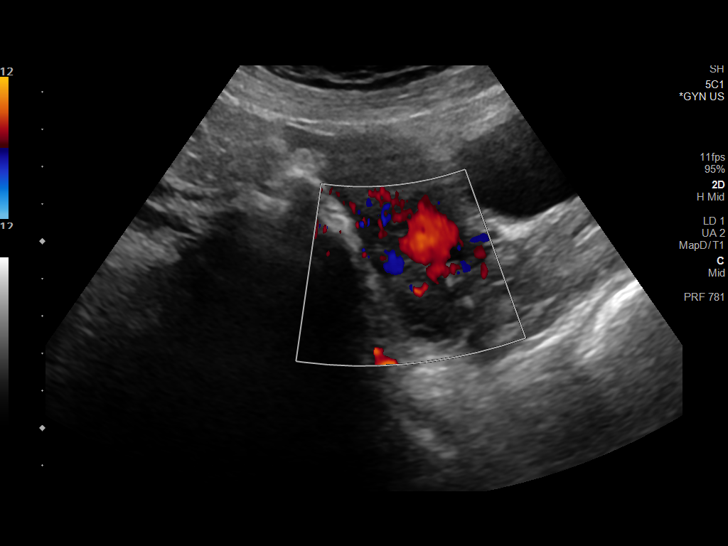
[im 62/62]
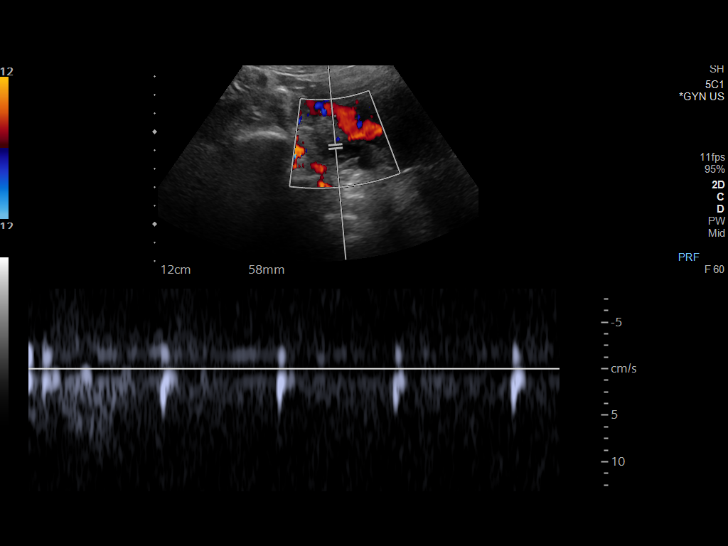

[14 of 25 positions shown; findings below may reference images not displayed]

FINDINGS: Uterus

Measurements: 9.1 x 4.7 x 6.7 cm = volume: 151 mL. No fibroids or
other mass visualized.

Endometrium

Thickness: 10 mm.  No focal abnormality visualized.

Right ovary

Measurements: 3.7 x 2.1 x 4.9 cm = volume: 7.5 mL. Normal
appearance/no adnexal mass.

Left ovary

Measurements: 3.1 x 2.5 x 3.6 cm = volume: 14.9 mL. Normal
appearance/no adnexal mass.

Pulsed Doppler evaluation demonstrates normal low-resistance
arterial and venous waveforms in both ovaries.

Other: Trace free fluid in the pelvis.
IMPRESSION: Unremarkable pelvic ultrasound.

## 2023-04-22 ENCOUNTER — Emergency Department (HOSPITAL_BASED_OUTPATIENT_CLINIC_OR_DEPARTMENT_OTHER)
Admission: EM | Admit: 2023-04-22 | Discharge: 2023-04-22 | Disposition: A | Discharge status: Home / Self Care | Payer: Self-pay | Attending: Emergency Medicine | Admitting: Emergency Medicine

## 2023-04-22 ENCOUNTER — Other Ambulatory Visit: Payer: Self-pay

## 2023-04-22 ENCOUNTER — Other Ambulatory Visit (HOSPITAL_BASED_OUTPATIENT_CLINIC_OR_DEPARTMENT_OTHER): Payer: Self-pay

## 2023-04-22 ENCOUNTER — Encounter (HOSPITAL_BASED_OUTPATIENT_CLINIC_OR_DEPARTMENT_OTHER): Payer: Self-pay

## 2023-04-22 DIAGNOSIS — J019 Acute sinusitis, unspecified: Secondary | ICD-10-CM | POA: Insufficient documentation

## 2023-04-22 DIAGNOSIS — B9689 Other specified bacterial agents as the cause of diseases classified elsewhere: Secondary | ICD-10-CM | POA: Insufficient documentation

## 2023-04-22 DIAGNOSIS — J069 Acute upper respiratory infection, unspecified: Secondary | ICD-10-CM | POA: Insufficient documentation

## 2023-04-22 DIAGNOSIS — Z20822 Contact with and (suspected) exposure to covid-19: Secondary | ICD-10-CM | POA: Insufficient documentation

## 2023-04-22 HISTORY — DX: Anemia, unspecified: D64.9

## 2023-04-22 LAB — RESP PANEL BY RT-PCR (RSV, FLU A&B, COVID)  RVPGX2
Influenza A by PCR: NEGATIVE
Influenza B by PCR: NEGATIVE
Resp Syncytial Virus by PCR: NEGATIVE
SARS Coronavirus 2 by RT PCR: NEGATIVE

## 2023-04-22 MED ORDER — AMOXICILLIN-POT CLAVULANATE 875-125 MG PO TABS
1.0000 | ORAL_TABLET | Freq: Two times a day (BID) | ORAL | 0 refills | Status: AC
  Filled 2023-04-22: qty 14, 7d supply, fill #0

## 2023-04-22 MED ORDER — METRONIDAZOLE 500 MG PO TABS
500.0000 mg | ORAL_TABLET | Freq: Two times a day (BID) | ORAL | 0 refills | Status: AC
  Filled 2023-04-22: qty 14, 7d supply, fill #0

## 2023-04-22 NOTE — ED Provider Notes (Signed)
[place] [facility] AT [facility] Provider Note   CSN: [id number] Arrival date & time: 04/22/23  1478     History  Chief Complaint  Patient presents with   Cough   Nasal Congestion    Natalie Vega is a 42 y.o. female.  HPI     42 year old female presents with concern for cough, nasal congestion and sinus discomfort for the last week.  Reports her daughter was also sick, but has improved now.  Her symptoms have gotten worse over the last week, now with development of sinus pressure and tenderness which is becoming severe, as well as left ear pain.  Has associated sore throat which is worsened with coughing.  Reports that the cough is dry.  Notes she has some postnasal drip, and green mucus from her nose.  Has not had nausea or vomiting.  Denies possibility being pregnant.  She is not short of breath, no leg swelling.  No fevers but she has had chills.  Reports that she tends to get severe sinus infections when she got sick, as well as ear infections.  Reports that she gets BV when she has antibiotics and this has been a consistent pattern.  Past Medical History:  Diagnosis Date   Anemia      Home Medications Prior to Admission medications   Medication Sig Start Date End Date Taking? Authorizing Provider  amoxicillin-clavulanate (AUGMENTIN) 875-125 MG tablet Take 1 tablet by mouth every 12 (twelve) hours for 7 days. 04/22/23 04/29/23 Yes Alvira Monday, MD  metroNIDAZOLE (FLAGYL) 500 MG tablet Take 1 tablet (500 mg total) by mouth 2 (two) times daily. 04/22/23  Yes Alvira Monday, MD      Allergies    Patient has no known allergies.    Review of Systems   Review of Systems  Physical Exam Updated Vital Signs BP (!) 147/71   Pulse 81   Temp 98 F (36.7 C) (Oral)   Resp 18   Ht 5\' 6"  (1.676 m)   Wt 79 kg   LMP 04/22/2023   SpO2 100%   BMI 28.11 kg/m  Physical Exam Vitals and nursing note reviewed.  Constitutional:      General: She  is not in acute distress.    Appearance: She is well-developed. She is not diaphoretic.  HENT:     Head: Normocephalic and atraumatic.     Right Ear: Tympanic membrane normal.     Left Ear: A middle ear effusion is present.     Nose:     Right Sinus: Maxillary sinus tenderness and frontal sinus tenderness present.     Left Sinus: Maxillary sinus tenderness and frontal sinus tenderness present.     Mouth/Throat:     Mouth: Mucous membranes are moist.     Pharynx: No oropharyngeal exudate or posterior oropharyngeal erythema.  Eyes:     Conjunctiva/sclera: Conjunctivae normal.  Cardiovascular:     Rate and Rhythm: Normal rate and regular rhythm.     Pulses: Normal pulses.     Heart sounds: Normal heart sounds. No murmur heard.    No friction rub. No gallop.  Pulmonary:     Effort: Pulmonary effort is normal. No respiratory distress.     Breath sounds: Normal breath sounds. No wheezing or rales.  Abdominal:     General: There is no distension.     Palpations: Abdomen is soft.     Tenderness: There is no abdominal tenderness. There is no guarding.  Musculoskeletal:  General: No tenderness.     Cervical back: Normal range of motion.  Skin:    General: Skin is warm and dry.     Findings: No erythema or rash.  Neurological:     Mental Status: She is alert and oriented to person, place, and time.     ED Results / Procedures / Treatments   Labs (all labs ordered are listed, but only abnormal results are displayed) Labs Reviewed  RESP PANEL BY RT-PCR (RSV, FLU A&B, COVID)  [id number]    EKG None  Radiology No results found.  Procedures Procedures    Medications Ordered in ED Medications - No data to display  ED Course/ Medical Decision Making/ A&P                                 Medical Decision Making    42 year old female presents with concern for cough, nasal congestion and sinus discomfort for the last week.  She has normal oxygenation, normal breath  sounds bilaterally, is afebrile, have low clinical suspicion at this time for pneumonia.  Do not see signs of exudate, pharyngeal swelling, low suspicion for strep throat, PTA, RPA, epiglottitis.  She does have significant bilateral maxillary sinus tenderness on exam, I do feel history and exam are consistent with a bacterial sinusitis which developed after development of a viral URI.  Will treat her sinusitis with Augmentin.  She is requesting a prescription for Flagyl for BV, reporting that she consistently gets BV, (not yeast infections) after she is treated with antibiotics.  I discussed the risks of antibiotics and side effects and she would like a rx to use just in case she develops these classic symptoms she has many times in the past to avoid further ED visits.  Patient discharged in stable condition with understanding of reasons to return.          Final Clinical Impression(s) / ED Diagnoses Final diagnoses:  Viral upper respiratory tract infection  Acute bacterial sinusitis    Rx / DC Orders ED Discharge Orders          Ordered    amoxicillin-clavulanate (AUGMENTIN) 875-125 MG tablet  Every 12 hours        04/22/23 1003    metroNIDAZOLE (FLAGYL) 500 MG tablet  2 times daily        04/22/23 1003              Alvira Monday, MD 04/22/23 1011

## 2023-04-22 NOTE — ED Triage Notes (Signed)
The patient has been having cough and congestion for one week.

## 2023-08-04 ENCOUNTER — Other Ambulatory Visit (HOSPITAL_BASED_OUTPATIENT_CLINIC_OR_DEPARTMENT_OTHER): Payer: Self-pay

## 2023-08-04 ENCOUNTER — Emergency Department (HOSPITAL_BASED_OUTPATIENT_CLINIC_OR_DEPARTMENT_OTHER)
Admission: EM | Admit: 2023-08-04 | Discharge: 2023-08-04 | Disposition: A | Discharge status: Home / Self Care | Payer: Self-pay | Attending: Emergency Medicine | Admitting: Emergency Medicine

## 2023-08-04 ENCOUNTER — Encounter (HOSPITAL_BASED_OUTPATIENT_CLINIC_OR_DEPARTMENT_OTHER): Payer: Self-pay | Admitting: Emergency Medicine

## 2023-08-04 ENCOUNTER — Other Ambulatory Visit: Payer: Self-pay

## 2023-08-04 DIAGNOSIS — K029 Dental caries, unspecified: Secondary | ICD-10-CM | POA: Insufficient documentation

## 2023-08-04 DIAGNOSIS — K047 Periapical abscess without sinus: Secondary | ICD-10-CM | POA: Insufficient documentation

## 2023-08-04 MED ORDER — TRAMADOL HCL 50 MG PO TABS
50.0000 mg | ORAL_TABLET | Freq: Four times a day (QID) | ORAL | 0 refills | Status: AC | PRN
  Filled 2023-08-04: qty 5, 2d supply, fill #0

## 2023-08-04 MED ORDER — FLUCONAZOLE 200 MG PO TABS
200.0000 mg | ORAL_TABLET | Freq: Every day | ORAL | 0 refills | Status: AC
  Filled 2023-08-04: qty 2, 2d supply, fill #0

## 2023-08-04 MED ORDER — AMOXICILLIN-POT CLAVULANATE 875-125 MG PO TABS
1.0000 | ORAL_TABLET | Freq: Two times a day (BID) | ORAL | 0 refills | Status: AC
  Filled 2023-08-04: qty 14, 7d supply, fill #0

## 2023-08-04 NOTE — Discharge Instructions (Signed)
 Is Tylenol 1000 mg every 6 hours.  You can take ibuprofen 600mg   every 8 hours.  Tramadol as needed for breakthrough pain.  You should not drive or operate heavy machinery when taking tramadol this is narcotic.  Take Augmentin as prescribed and make sure you finish the entire dose.  Try to take with food and water.  If you have yeast infection you can take Diflucan.  Diflucan should be taken once but if symptoms continue we can repeat dose in 72 hours.  Please return to emergency room if you have any new or worsening symptoms.  Follow-up with dentist.

## 2023-08-04 NOTE — ED Triage Notes (Signed)
 Left lower dental pain x 2 days.  No known fever.

## 2023-08-04 NOTE — ED Provider Notes (Signed)
 [place] [facility] Provider Note   CSN: [id number] Arrival date & time: 08/04/23  1117     History  Chief Complaint  Patient presents with   Dental Pain    Natalie Vega is a 43 y.o. female past medical history of anemia presenting to emergency room with 2 days of left lower dental pain.  Patient has small dental abscess.  She reports she has poor dentition and knows she needs to see a dentist.  This has happened in the past and has improved with Augmentin.  Patient reports she does not have insurance that she is unable to see a dentist.  She has not had any fever or chills at home.  She has no difficulty handling secretions and has normal phonation.  Reports it hurts to eat.  Denies any chest pain, shortness of breath, difficulty breathing, sore throat, fevers or chills.   Dental Pain      Home Medications Prior to Admission medications   Medication Sig Start Date End Date Taking? Authorizing Provider  metroNIDAZOLE (FLAGYL) 500 MG tablet Take 1 tablet (500 mg total) by mouth 2 (two) times daily. 04/22/23   Alvira Monday, MD      Allergies    Patient has no known allergies.    Review of Systems   Review of Systems  HENT:  Positive for dental problem.     Physical Exam Updated Vital Signs BP 133/78 (BP Location: Left Arm)   Pulse 81   Temp 98.2 F (36.8 C)   Resp 18   Ht 5\' 5"  (1.651 m)   Wt 81.6 kg   LMP 07/14/2023   SpO2 100%   BMI 29.95 kg/m  Physical Exam Vitals and nursing note reviewed.  Constitutional:      General: She is not in acute distress.    Appearance: She is not toxic-appearing.  HENT:     Head: Normocephalic and atraumatic.     Mouth/Throat:     Dentition: Abnormal dentition. Gingival swelling, dental caries and dental abscesses present. No dental tenderness.     Pharynx: Uvula midline.   Eyes:     General: No scleral icterus.    Conjunctiva/sclera: Conjunctivae normal.  Cardiovascular:      Rate and Rhythm: Normal rate and regular rhythm.     Pulses: Normal pulses.     Heart sounds: Normal heart sounds.  Pulmonary:     Effort: Pulmonary effort is normal. No respiratory distress.     Breath sounds: Normal breath sounds.  Abdominal:     General: Abdomen is flat. Bowel sounds are normal.     Palpations: Abdomen is soft.     Tenderness: There is no abdominal tenderness.  Musculoskeletal:     Right lower leg: No edema.     Left lower leg: No edema.  Skin:    General: Skin is warm and dry.     Findings: No lesion.  Neurological:     General: No focal deficit present.     Mental Status: She is alert and oriented to person, place, and time. Mental status is at baseline.     ED Results / Procedures / Treatments   Labs (all labs ordered are listed, but only abnormal results are displayed) Labs Reviewed - No data to display  EKG None  Radiology No results found.  Procedures Procedures    Medications Ordered in ED Medications - No data to display  ED Course/ Medical Decision Making/ A&P  Medical Decision Making Risk Prescription drug management.   This patient presents to the ED for concern of dental pain, this involves an extensive number of treatment options, and is a complaint that carries with it a high risk of complications and morbidity.  The differential diagnosis includes tonsillar abscess, Ludwig's angina, retropharyngeal abscess, strep throat, dental abscess, dental pain, poor dentition     Problem List / ED Course / Critical interventions / Medication management  Patient reporting to the emergency room with complaint of dental pain.  This has been ongoing for 2 days and gradually getting worse.  She reports she has history of similar and it has improved with Augmentin.  On physical exam she is well-appearing and in no acute distress.  She is hemodynamically stable.  She does have overall poor dentition and area of  gingival swelling and left lower molar.  She does have small dental abscess that appears to be draining spontaneously.  It is less .5cm in size.  Discussed doing incision and drainage as well as dental block however patient declines.  She would like to use over-the-counter management for pain control as well as start antibiotic.  She was given dental resource guide and strict return precautions.  Will send home with antibiotic and pain control.  Feel she is stable for discharge.  I have reviewed the patients home medicines and have made adjustments as needed   Plan  F/u w/ PCP in 2-3d to ensure resolution of sx.  Patient was given return precautions. Patient stable for discharge at this time.  Patient educated on sx/dx and verbalized understanding of plan. Return to ER w/ new or worsening sx.          Final Clinical Impression(s) / ED Diagnoses Final diagnoses:  Dental caries  Dental abscess    Rx / DC Orders ED Discharge Orders          Ordered    traMADol (ULTRAM) 50 MG tablet  Every 6 hours PRN        08/04/23 1257    amoxicillin-clavulanate (AUGMENTIN) 875-125 MG tablet  Every 12 hours        08/04/23 1257    fluconazole (DIFLUCAN) 200 MG tablet  Daily        08/04/23 1257              Tirza Senteno, Horald Chestnut, PA-C 08/04/23 1304    Arby Barrette, MD 08/04/23 1553

## 2023-08-25 ENCOUNTER — Other Ambulatory Visit: Payer: Self-pay

## 2023-08-25 ENCOUNTER — Encounter (HOSPITAL_BASED_OUTPATIENT_CLINIC_OR_DEPARTMENT_OTHER): Payer: Self-pay | Admitting: Emergency Medicine

## 2023-08-25 ENCOUNTER — Emergency Department (HOSPITAL_BASED_OUTPATIENT_CLINIC_OR_DEPARTMENT_OTHER)
Admission: EM | Admit: 2023-08-25 | Discharge: 2023-08-25 | Disposition: A | Discharge status: Home / Self Care | Payer: Self-pay | Attending: Emergency Medicine | Admitting: Emergency Medicine

## 2023-08-25 DIAGNOSIS — R350 Frequency of micturition: Secondary | ICD-10-CM

## 2023-08-25 DIAGNOSIS — R102 Pelvic and perineal pain: Secondary | ICD-10-CM | POA: Insufficient documentation

## 2023-08-25 LAB — URINALYSIS, ROUTINE W REFLEX MICROSCOPIC
Bilirubin Urine: NEGATIVE
Glucose, UA: NEGATIVE mg/dL
Hgb urine dipstick: NEGATIVE
Ketones, ur: NEGATIVE mg/dL
Leukocytes,Ua: NEGATIVE
Nitrite: NEGATIVE
Protein, ur: NEGATIVE mg/dL
Specific Gravity, Urine: 1.01 (ref 1.005–1.030)
pH: 5.5 (ref 5.0–8.0)

## 2023-08-25 LAB — WET PREP, GENITAL
Clue Cells Wet Prep HPF POC: NONE SEEN
Sperm: NONE SEEN
Trich, Wet Prep: NONE SEEN
WBC, Wet Prep HPF POC: <10 (ref <10)
Yeast Wet Prep HPF POC: NONE SEEN

## 2023-08-25 LAB — PREGNANCY, URINE: Preg Test, Ur: NEGATIVE

## 2023-08-25 NOTE — ED Triage Notes (Signed)
 C/o increased urinary frequency and lower abd pain x 3 days. Denies fevers.

## 2023-08-25 NOTE — ED Provider Notes (Signed)
 [place] [facility] Provider Note   CSN: [id number] Arrival date & time: 08/25/23  1534     History  Chief Complaint  Patient presents with   Urinary Frequency    Uyen Eichholz is a 43 y.o. female.  Patient to ED with complaint of urinary frequency for several days. She reports left pelvic pain since yesterday after having intercourse with her boyfriend. No vaginal discharge, fever, nausea.   The history is provided by the patient.  Urinary Frequency       Home Medications Prior to Admission medications   Medication Sig Start Date End Date Taking? Authorizing Provider  amoxicillin-clavulanate (AUGMENTIN) 875-125 MG tablet Take 1 tablet by mouth every 12 (twelve) hours. 08/04/23   Barrett, Horald Chestnut, PA-C  metroNIDAZOLE (FLAGYL) 500 MG tablet Take 1 tablet (500 mg total) by mouth 2 (two) times daily. 04/22/23   Alvira Monday, MD  traMADol (ULTRAM) 50 MG tablet Take 1 tablet (50 mg total) by mouth every 6 (six) hours as needed for severe pain (pain score 7-10). 08/04/23   Barrett, Horald Chestnut, PA-C      Allergies    Patient has no known allergies.    Review of Systems   Review of Systems  Genitourinary:  Positive for frequency.    Physical Exam Updated Vital Signs BP (!) 145/74 (BP Location: Right Arm)   Pulse 84   Temp 98.2 F (36.8 C) (Oral)   Resp 16   LMP 07/14/2023   SpO2 100%  Physical Exam Vitals and nursing note reviewed.  Constitutional:      General: She is not in acute distress.    Appearance: She is well-developed. She is not ill-appearing.  Pulmonary:     Effort: Pulmonary effort is normal.  Genitourinary:    General: Normal vulva.     Vagina: No vaginal discharge.     Comments: Cervix nonfriable, nontender, no discharge. Mild left adnexal tenderness without fullness or mass.  Musculoskeletal:        General: Normal range of motion.     Cervical back: Normal range of motion.  Skin:    General: Skin is  warm and dry.  Neurological:     Mental Status: She is alert and oriented to person, place, and time.     ED Results / Procedures / Treatments   Labs (all labs ordered are listed, but only abnormal results are displayed) Labs Reviewed  WET PREP, GENITAL  URINALYSIS, ROUTINE W REFLEX MICROSCOPIC  PREGNANCY, URINE  GC/CHLAMYDIA PROBE AMP ([place]) NOT AT [facility]   Results for orders placed or performed during the hospital encounter of 08/25/23  Urinalysis, Routine w reflex microscopic -Urine, Clean Catch   Collection Time: 08/25/23  4:00 PM  Result Value Ref Range   Color, Urine YELLOW YELLOW   APPearance CLEAR CLEAR   Specific Gravity, Urine 1.010 1.005 - 1.030   pH 5.5 5.0 - 8.0   Glucose, UA NEGATIVE NEGATIVE mg/dL   Hgb urine dipstick NEGATIVE NEGATIVE   Bilirubin Urine NEGATIVE NEGATIVE   Ketones, ur NEGATIVE NEGATIVE mg/dL   Protein, ur NEGATIVE NEGATIVE mg/dL   Nitrite NEGATIVE NEGATIVE   Leukocytes,Ua NEGATIVE NEGATIVE  Pregnancy, urine   Collection Time: 08/25/23  4:00 PM  Result Value Ref Range   Preg Test, Ur NEGATIVE NEGATIVE  Wet prep, genital   Collection Time: 08/25/23  8:25 PM   Specimen: PATH Cytology Cervicovaginal Ancillary Only  Result Value Ref Range   Yeast Wet  Prep HPF POC NONE SEEN NONE SEEN   Trich, Wet Prep NONE SEEN NONE SEEN   Clue Cells Wet Prep HPF POC NONE SEEN NONE SEEN   WBC, Wet Prep HPF POC <10 <10   Sperm NONE SEEN     EKG None  Radiology No results found.  Procedures Procedures    Medications Ordered in ED Medications - No data to display  ED Course/ Medical Decision Making/ A&P Clinical Course as of 08/25/23 2113  Thu Aug 25, 2023  2107 Patient to ED with concern for STD, urinary frequency. Pelvic exam essentially benign, no CMT, discharge. Wet prep negative. Not pregnant. VSS, no fever, vomiting. Very mild left adnexal tenderness nonconcerning for underlying process (TOA, PID). Offered treatment for GC/chlamydia, but  patient declined. Discussed reviewed results in MyChart.  [SU]  2113 UA negative for infection.   [SU]    Clinical Course User Index [SU] Elpidio Anis, PA-C                                 Medical Decision Making Amount and/or Complexity of Data Reviewed Labs: ordered.           Final Clinical Impression(s) / ED Diagnoses Final diagnoses:  Urinary frequency    Rx / DC Orders ED Discharge Orders     None         Danne Harbor 08/25/23 2113    Glyn Ade, MD 08/25/23 2235

## 2023-08-25 NOTE — Discharge Instructions (Signed)
 Follow up with your doctor for recheck if symptoms of urinary frequency persist.   If the discomfort in the left pelvis becomes worse/severe, you develop a fever, vaginal discharge, or abnormal vaginal bleeding, return to the ED for further evaluation.

## 2023-08-26 LAB — GC/CHLAMYDIA PROBE AMP (~~LOC~~) NOT AT ARMC
Chlamydia: NEGATIVE
Comment: NEGATIVE
Comment: NORMAL
Neisseria Gonorrhea: NEGATIVE

## 2024-01-19 ENCOUNTER — Other Ambulatory Visit (HOSPITAL_COMMUNITY): Payer: Self-pay
